# Patient Record
Sex: Female | Born: 1980 | Race: White | Hispanic: No | Marital: Married | Smoking: Never smoker
Health system: Southern US, Community
[De-identification: ages and names within clinical notes are randomized; demographics above are authoritative.]

## PROBLEM LIST (undated history)

## (undated) DIAGNOSIS — J45909 Unspecified asthma, uncomplicated: Secondary | ICD-10-CM

## (undated) HISTORY — DX: Unspecified asthma, uncomplicated: J45.909

## (undated) HISTORY — PX: NO PAST SURGERIES: SHX2092

---

## 2006-10-25 ENCOUNTER — Ambulatory Visit: Payer: Self-pay | Admitting: Family Medicine

## 2007-11-02 ENCOUNTER — Other Ambulatory Visit: Admission: RE | Admit: 2007-11-02 | Discharge: 2007-11-02 | Payer: Self-pay | Admitting: Obstetrics and Gynecology

## 2009-01-28 ENCOUNTER — Other Ambulatory Visit: Admission: RE | Admit: 2009-01-28 | Discharge: 2009-01-28 | Payer: Self-pay | Admitting: Obstetrics and Gynecology

## 2010-01-29 ENCOUNTER — Other Ambulatory Visit: Admission: RE | Admit: 2010-01-29 | Discharge: 2010-01-29 | Payer: Self-pay | Admitting: Obstetrics and Gynecology

## 2011-08-18 ENCOUNTER — Other Ambulatory Visit (HOSPITAL_COMMUNITY)
Admission: RE | Admit: 2011-08-18 | Discharge: 2011-08-18 | Disposition: A | Discharge status: Home / Self Care | Payer: BC Managed Care – PPO | Source: Ambulatory Visit | Attending: Obstetrics and Gynecology | Admitting: Obstetrics and Gynecology

## 2011-08-18 ENCOUNTER — Other Ambulatory Visit: Payer: Self-pay | Admitting: Adult Health

## 2011-08-18 DIAGNOSIS — Z01419 Encounter for gynecological examination (general) (routine) without abnormal findings: Secondary | ICD-10-CM | POA: Insufficient documentation

## 2013-04-16 ENCOUNTER — Encounter: Payer: Self-pay | Admitting: Adult Health

## 2013-04-16 ENCOUNTER — Other Ambulatory Visit (HOSPITAL_COMMUNITY)
Admission: RE | Admit: 2013-04-16 | Discharge: 2013-04-16 | Disposition: A | Discharge status: Home / Self Care | Payer: BC Managed Care – PPO | Source: Ambulatory Visit | Attending: Adult Health | Admitting: Adult Health

## 2013-04-16 ENCOUNTER — Ambulatory Visit (INDEPENDENT_AMBULATORY_CARE_PROVIDER_SITE_OTHER): Payer: BC Managed Care – PPO | Admitting: Adult Health

## 2013-04-16 VITALS — BP 132/88 | HR 76 | Ht 66.0 in | Wt 229.8 lb

## 2013-04-16 DIAGNOSIS — Z01419 Encounter for gynecological examination (general) (routine) without abnormal findings: Secondary | ICD-10-CM | POA: Insufficient documentation

## 2013-04-16 DIAGNOSIS — Z1151 Encounter for screening for human papillomavirus (HPV): Secondary | ICD-10-CM | POA: Insufficient documentation

## 2013-04-16 DIAGNOSIS — Z319 Encounter for procreative management, unspecified: Secondary | ICD-10-CM

## 2013-04-16 NOTE — Progress Notes (Signed)
Patient ID: Krista Caldwell, female   DOB: September 08, 1981, 32 y.o.   MRN: [id number] History of Present Illness: Krista Caldwell is a 32 year old white female, married in for a pap and physical.   Current Medications, Allergies, Past Medical History, Past Surgical History, Family History and Social History were reviewed in [employer] electronic medical record.     Review of Systems:  Patient denies any headaches, blurred vision, shortness of breath, chest pain, abdominal pain, problems with bowel movements, urination, or intercourse. No joint pain, has had a year of family stress with several deaths.She has regular periods and has tried to get period, with out success.Her husband is 76 and has heart problems but has 2 children from prior relationship.   Physical Exam:BP 132/88  Pulse 76  Ht 5\' 6"  (1.676 m)  Wt 229 lb 12.8 oz (104.237 kg)  BMI 37.11 kg/m2  LMP 03/19/2013 General:  Well developed, well nourished, no acute distress Skin:  Warm and dry Neck:  Midline trachea, normal thyroid Lungs; Clear to auscultation bilaterally Breast:  No dominant palpable mass, retraction, or nipple discharge Cardiovascular: Regular rate and rhythm Abdomen:  Soft, non tender, no hepatosplenomegaly Pelvic:  External genitalia is normal in appearance.  The vagina is normal in appearance. The cervix is nullapreous. Pap performed with HPV. Uterus is felt to be normal size, shape, and contour.  No                adnexal masses or tenderness noted. Extremities:  No swelling or varicosities noted Psych:  Alert and cooperative, seems happy Discussed ovulation, timing of sex, and future labs and even trying clomid.Discussed semen analysis and HSG if needed, husband to increase VItamin C intake with OJ.   Impression: Yearly GYN exam Desires pregnancy    Plan: Physical in 1 year Take prenatal vitamin or folic acid Call with next menses to check progesterone level and TSH on day 21 of cycle

## 2013-04-16 NOTE — Patient Instructions (Addendum)
Call with menses, to check progesterone level and TSH Physical in 1 year  Take prenatal vitamin

## 2013-04-17 ENCOUNTER — Encounter: Payer: Self-pay | Admitting: Adult Health

## 2013-05-08 ENCOUNTER — Other Ambulatory Visit: Payer: BC Managed Care – PPO

## 2013-05-08 DIAGNOSIS — Z319 Encounter for procreative management, unspecified: Secondary | ICD-10-CM

## 2013-05-08 LAB — TSH: TSH: 3.271 u[IU]/mL (ref 0.350–4.500)

## 2013-05-10 ENCOUNTER — Telehealth: Payer: Self-pay | Admitting: Adult Health

## 2013-05-10 ENCOUNTER — Other Ambulatory Visit: Payer: Self-pay | Admitting: Adult Health

## 2013-05-10 NOTE — Progress Notes (Signed)
Waiting on progesterone level

## 2013-05-10 NOTE — Telephone Encounter (Signed)
Waiting on progesterone level 

## 2013-05-15 ENCOUNTER — Telehealth: Payer: Self-pay | Admitting: Adult Health

## 2013-05-15 MED ORDER — CLOMIPHENE CITRATE 50 MG PO TABS: 50.0000 mg | ORAL_TABLET | Freq: Every day | ORAL | Status: DC

## 2013-05-15 NOTE — Telephone Encounter (Signed)
Pt wants to try clomid, will call in and she will return on 8/27 for progesterone level.

## 2013-05-30 ENCOUNTER — Other Ambulatory Visit: Payer: BC Managed Care – PPO

## 2013-05-30 DIAGNOSIS — Z319 Encounter for procreative management, unspecified: Secondary | ICD-10-CM

## 2013-05-31 ENCOUNTER — Encounter: Payer: Self-pay | Admitting: Adult Health

## 2013-05-31 ENCOUNTER — Telehealth: Payer: Self-pay | Admitting: Adult Health

## 2013-05-31 NOTE — Telephone Encounter (Signed)
Left message that progesterone level was 14.1 which was good

## 2013-06-05 ENCOUNTER — Other Ambulatory Visit: Payer: Self-pay | Admitting: Adult Health

## 2013-06-05 MED ORDER — CLOMIPHENE CITRATE 50 MG PO TABS: ORAL_TABLET | ORAL | Status: DC

## 2013-06-25 ENCOUNTER — Other Ambulatory Visit: Payer: BC Managed Care – PPO

## 2013-06-25 DIAGNOSIS — IMO0002 Reserved for concepts with insufficient information to code with codable children: Secondary | ICD-10-CM

## 2013-06-26 ENCOUNTER — Encounter: Payer: Self-pay | Admitting: Adult Health

## 2013-06-26 ENCOUNTER — Telehealth: Payer: Self-pay | Admitting: Adult Health

## 2013-06-26 LAB — PROGESTERONE: Progesterone: 19.4 ng/mL

## 2013-06-26 NOTE — Telephone Encounter (Signed)
Sent mychart message

## 2013-07-02 ENCOUNTER — Other Ambulatory Visit: Payer: Self-pay | Admitting: Adult Health

## 2013-07-02 MED ORDER — CLOMIPHENE CITRATE 50 MG PO TABS: ORAL_TABLET | ORAL | Status: DC

## 2013-07-04 ENCOUNTER — Other Ambulatory Visit: Payer: Self-pay | Admitting: *Deleted

## 2013-07-24 ENCOUNTER — Other Ambulatory Visit: Payer: BC Managed Care – PPO

## 2013-07-24 DIAGNOSIS — IMO0002 Reserved for concepts with insufficient information to code with codable children: Secondary | ICD-10-CM

## 2013-07-25 ENCOUNTER — Encounter: Payer: Self-pay | Admitting: Adult Health

## 2013-07-25 LAB — PROGESTERONE: Progesterone: 22.4 ng/mL

## 2013-08-07 ENCOUNTER — Encounter: Payer: Self-pay | Admitting: Obstetrics & Gynecology

## 2013-08-07 ENCOUNTER — Ambulatory Visit (INDEPENDENT_AMBULATORY_CARE_PROVIDER_SITE_OTHER): Payer: BC Managed Care – PPO | Admitting: Obstetrics & Gynecology

## 2013-08-07 VITALS — BP 100/80 | Ht 66.0 in | Wt 230.0 lb

## 2013-08-07 DIAGNOSIS — N979 Female infertility, unspecified: Secondary | ICD-10-CM

## 2013-08-09 ENCOUNTER — Other Ambulatory Visit: Payer: Self-pay

## 2013-08-20 ENCOUNTER — Encounter: Payer: Self-pay | Admitting: Obstetrics & Gynecology

## 2013-09-05 ENCOUNTER — Other Ambulatory Visit (HOSPITAL_COMMUNITY): Payer: BC Managed Care – PPO

## 2013-09-05 ENCOUNTER — Ambulatory Visit (HOSPITAL_COMMUNITY)
Admission: RE | Admit: 2013-09-05 | Discharge: 2013-09-05 | Disposition: A | Discharge status: Home / Self Care | Payer: BC Managed Care – PPO | Source: Ambulatory Visit | Attending: Obstetrics & Gynecology | Admitting: Obstetrics & Gynecology

## 2013-09-05 ENCOUNTER — Encounter (HOSPITAL_COMMUNITY): Payer: Self-pay

## 2013-09-05 ENCOUNTER — Other Ambulatory Visit (HOSPITAL_COMMUNITY): Payer: Self-pay | Admitting: Obstetrics & Gynecology

## 2013-09-05 ENCOUNTER — Other Ambulatory Visit: Payer: Self-pay | Admitting: Obstetrics & Gynecology

## 2013-09-05 DIAGNOSIS — IMO0002 Reserved for concepts with insufficient information to code with codable children: Secondary | ICD-10-CM

## 2013-09-05 DIAGNOSIS — R9389 Abnormal findings on diagnostic imaging of other specified body structures: Secondary | ICD-10-CM | POA: Insufficient documentation

## 2013-09-05 DIAGNOSIS — N979 Female infertility, unspecified: Secondary | ICD-10-CM | POA: Insufficient documentation

## 2013-09-05 MED ORDER — POVIDONE-IODINE 10 % EX SOLN
CUTANEOUS | Status: AC
  Filled 2013-09-05: qty 15

## 2013-09-05 MED ORDER — IOHEXOL 350 MG/ML SOLN
50.0000 mL | Freq: Once | INTRAVENOUS | Status: AC | PRN
  Administered 2013-09-05: 15 mL via INTRAVENOUS

## 2013-09-18 ENCOUNTER — Encounter: Payer: Self-pay | Admitting: Obstetrics & Gynecology

## 2013-10-16 ENCOUNTER — Encounter: Payer: Self-pay | Admitting: Obstetrics & Gynecology

## 2013-11-05 DIAGNOSIS — N979 Female infertility, unspecified: Secondary | ICD-10-CM | POA: Insufficient documentation

## 2013-11-05 NOTE — Progress Notes (Signed)
Patient ID: Krista Caldwell, female   DOB: 10/21/1980, 33 y.o.   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 Has regular menses normal volume and discomfort No dyspareunia No hhistory of pid or other pelvic infections No history of early loss  Will need to check an HSG Probable female factor  Will need to evaluate that after HSG  Past Medical History  Diagnosis Date  . Asthma     History reviewed. No pertinent past surgical history.  OB History   Grav Para Term Preterm Abortions TAB SAB Ect Mult Living                  Allergies  Allergen Reactions  . Motrin [Ibuprofen]     Nose bleeds    History   Social History  . Marital Status: Single    Spouse Name: N/A    Number of Children: N/A  . Years of Education: N/A   Social History Main Topics  . Smoking status: Never Smoker   . Smokeless tobacco: Never Used  . Alcohol Use: No  . Drug Use: No  . Sexual Activity: Yes   Other Topics Concern  . None   Social History Narrative  . None    Family History  Problem Relation Age of Onset  . Stroke Maternal Grandmother   . Heart disease Maternal Grandfather     heart attack  . Asthma Paternal Grandmother   . Dementia Paternal Grandfather

## 2014-01-25 IMAGING — RF DG HYSTEROGRAM
4 series · 4 of 4 positions shown · IV contrast (omnipaque)
Comparison: None

FLUOROSCOPY TIME:  1 min 42 seconds

CLINICAL DATA: Primary infertility

EXAM:
HYSTEROSALPINGOGRAM
TECHNIQUE: Following cleansing of the cervix and vagina with Betadine solution,
the referring physician performed a hysterosalpingogram using a
5-French hysterosalpingogram catheter and Omnipaque 300 contrast.
The patient tolerated the examination without difficulty.

[Series 1: run · 1 of 1 slices shown (1 of 4)]
[im 1/1]
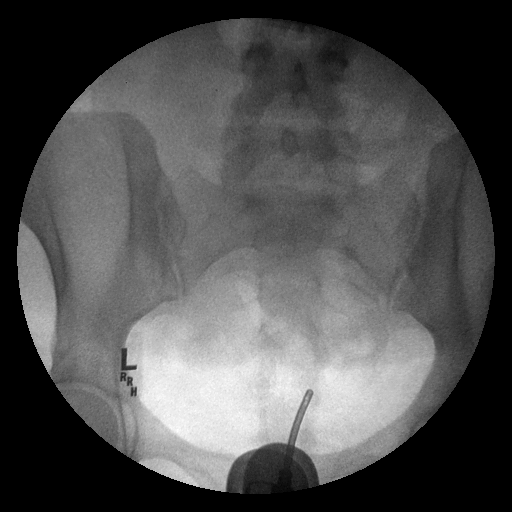

[Series 2: run · 1 of 1 slices shown (2 of 4)]
[im 1/1]
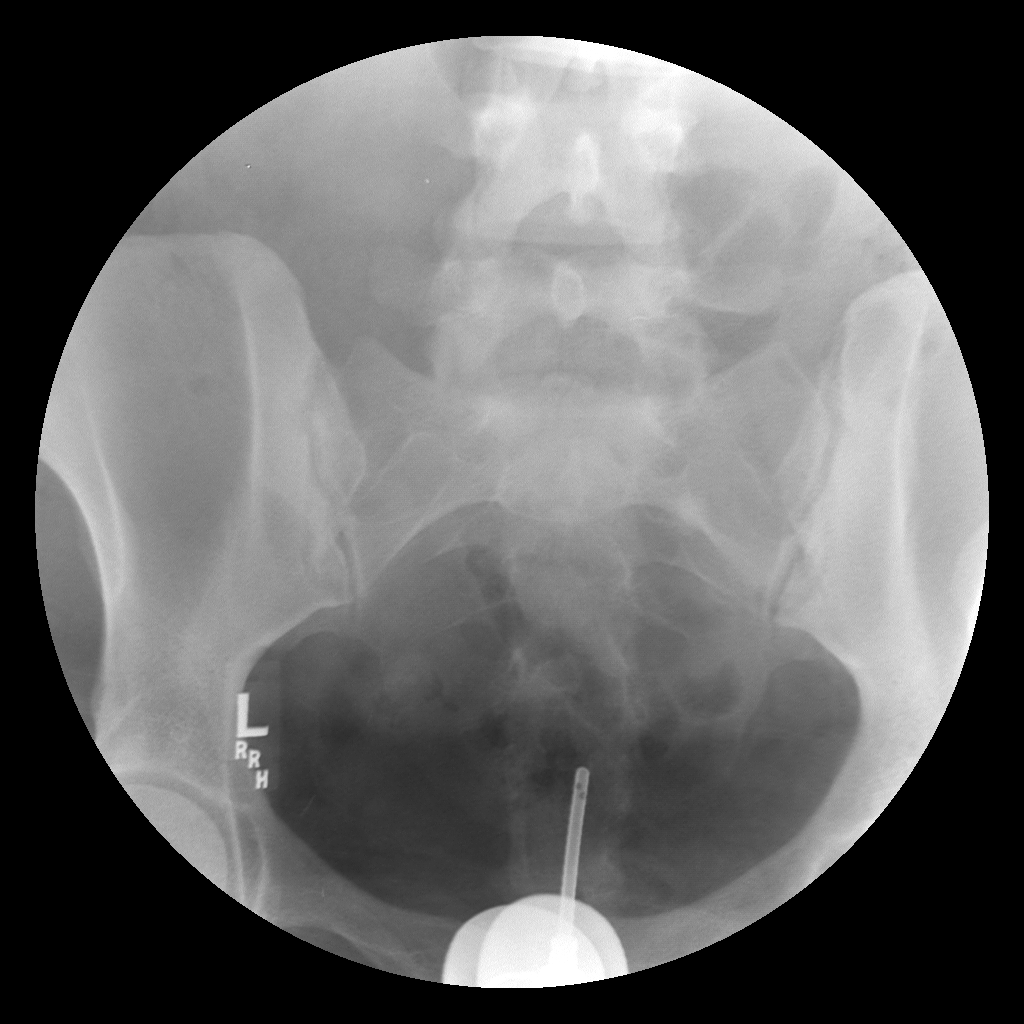

[Series 3: run · 1 of 1 slices shown (3 of 4)]
[im 1/1]
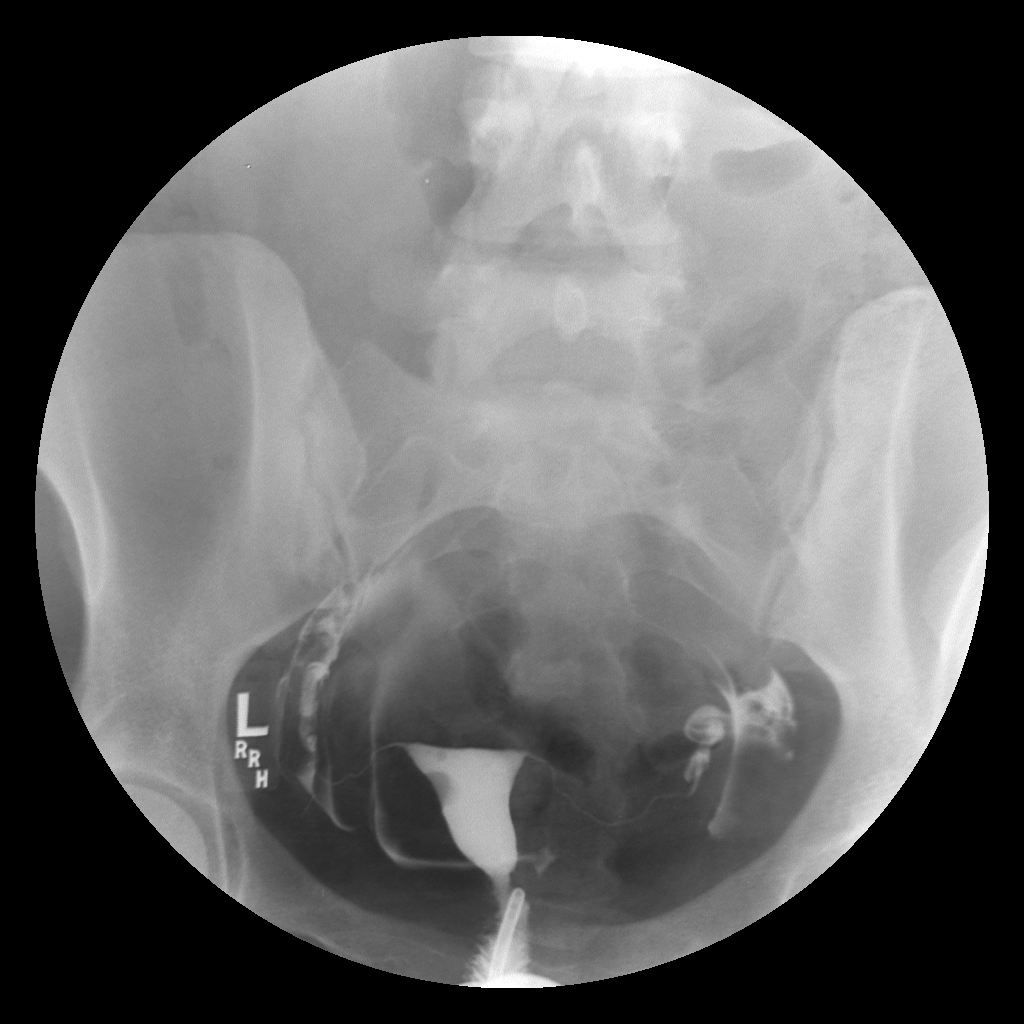

[Series 4: run · 1 of 1 slices shown (4 of 4)]
[im 1/1]
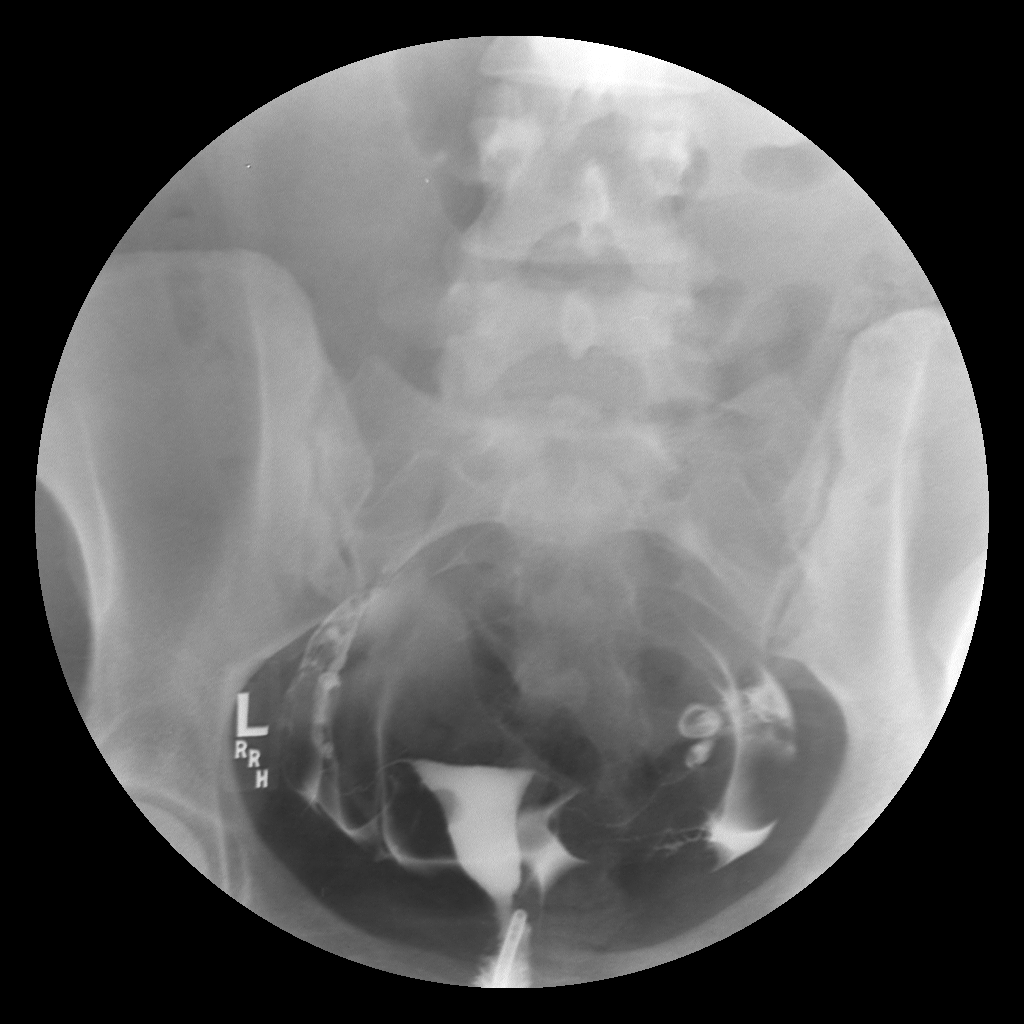

[4 of 4 positions shown; findings below may reference images not displayed]

FINDINGS: Endometrial cavity distends normally.

Small filling defect identified along right lateral aspect question
small polyp or submucosal leiomyoma.

Additional small air bubble is noted on series 3, no longer seen on
series 4.

Patent fallopian tubes bilaterally was free spillage of contrast
into the pelvic peritoneal cavity.
IMPRESSION: Patent fallopian tubes bilaterally.

Small polyp versus submucosal leiomyoma along the right lateral
aspect of the endometrial canal.

## 2014-06-13 ENCOUNTER — Encounter: Payer: Self-pay | Admitting: Obstetrics & Gynecology

## 2014-08-21 LAB — US OB COMP LESS 14 WKS

## 2014-08-26 ENCOUNTER — Telehealth: Payer: Self-pay | Admitting: Adult Health

## 2014-08-26 NOTE — Telephone Encounter (Signed)
Left message saying congratulations on pregnancy

## 2014-08-27 ENCOUNTER — Other Ambulatory Visit: Payer: Self-pay | Admitting: Obstetrics and Gynecology

## 2014-08-27 DIAGNOSIS — O3680X0 Pregnancy with inconclusive fetal viability, not applicable or unspecified: Secondary | ICD-10-CM

## 2014-09-02 ENCOUNTER — Ambulatory Visit (INDEPENDENT_AMBULATORY_CARE_PROVIDER_SITE_OTHER): Payer: BC Managed Care – PPO

## 2014-09-02 ENCOUNTER — Other Ambulatory Visit: Payer: Self-pay | Admitting: Obstetrics and Gynecology

## 2014-09-02 DIAGNOSIS — O0901 Supervision of pregnancy with history of infertility, first trimester: Secondary | ICD-10-CM

## 2014-09-02 DIAGNOSIS — O3680X0 Pregnancy with inconclusive fetal viability, not applicable or unspecified: Secondary | ICD-10-CM

## 2014-09-02 NOTE — Progress Notes (Signed)
U/S(8+6wks)-single IUP with +FCA noted, FHR-176 bpm, CRL c/w dates, cx appears closed, bilateral adnexa appears WNL

## 2014-09-09 ENCOUNTER — Encounter: Payer: Self-pay | Admitting: Women's Health

## 2014-09-09 ENCOUNTER — Ambulatory Visit (INDEPENDENT_AMBULATORY_CARE_PROVIDER_SITE_OTHER): Payer: BC Managed Care – PPO | Admitting: Women's Health

## 2014-09-09 VITALS — BP 118/80 | Wt 221.0 lb

## 2014-09-09 DIAGNOSIS — Z0283 Encounter for blood-alcohol and blood-drug test: Secondary | ICD-10-CM

## 2014-09-09 DIAGNOSIS — Z118 Encounter for screening for other infectious and parasitic diseases: Secondary | ICD-10-CM

## 2014-09-09 DIAGNOSIS — Z0184 Encounter for antibody response examination: Secondary | ICD-10-CM

## 2014-09-09 DIAGNOSIS — R071 Chest pain on breathing: Secondary | ICD-10-CM

## 2014-09-09 DIAGNOSIS — Z1159 Encounter for screening for other viral diseases: Secondary | ICD-10-CM

## 2014-09-09 DIAGNOSIS — Z1389 Encounter for screening for other disorder: Secondary | ICD-10-CM

## 2014-09-09 DIAGNOSIS — Z3400 Encounter for supervision of normal first pregnancy, unspecified trimester: Secondary | ICD-10-CM | POA: Insufficient documentation

## 2014-09-09 DIAGNOSIS — Z331 Pregnant state, incidental: Secondary | ICD-10-CM

## 2014-09-09 DIAGNOSIS — R0789 Other chest pain: Secondary | ICD-10-CM

## 2014-09-09 DIAGNOSIS — Z3401 Encounter for supervision of normal first pregnancy, first trimester: Secondary | ICD-10-CM

## 2014-09-09 DIAGNOSIS — Z113 Encounter for screening for infections with a predominantly sexual mode of transmission: Secondary | ICD-10-CM

## 2014-09-09 DIAGNOSIS — Z114 Encounter for screening for human immunodeficiency virus [HIV]: Secondary | ICD-10-CM

## 2014-09-09 LAB — POCT URINALYSIS DIPSTICK
Glucose, UA: NEGATIVE
Ketones, UA: NEGATIVE
Leukocytes, UA: NEGATIVE
Nitrite, UA: NEGATIVE
PROTEIN UA: NEGATIVE
RBC UA: NEGATIVE

## 2014-09-09 NOTE — Progress Notes (Signed)
  Subjective:  Krista Caldwell is a 33 y.o. G1P0 Caucasian female at 7967w6d by LMP c/w 8wk u/s.  being seen today for her first obstetrical visit.  Her obstetrical history is significant for infertility: tried to conceive x 4 years w/o success[place] On daily progesterone per infertility md until out of 1st trimester. Pregnancy history fully reviewed.  Patient reports some nausea- declines meds at this time. Bad cold ~2wks ago, took round of ampicillin- cold has resolved but has had pain in upper Rt lateral rib x last week. Hurts to touch/lay on area. . Denies vb, cramping, uti s/s, abnormal/malodorous vag d/c, or vulvovaginal itching/irritation.  BP 118/80 mmHg  Wt 221 lb (100.245 kg)  LMP 07/02/2014 (Exact Date)  HISTORY: OB History  Gravida Para Term Preterm AB SAB TAB Ectopic Multiple Living  1             # Outcome Date GA Lbr Len/2nd Weight Sex Delivery Anes PTL Lv  1 Current              Past Medical History  Diagnosis Date  . Asthma    History reviewed. No pertinent past surgical history. Family History  Problem Relation Age of Onset  . Stroke Maternal Grandmother   . Heart disease Maternal Grandfather     heart attack  . Asthma Paternal Grandmother   . Dementia Paternal Grandfather     Exam   System:     General: Well developed & nourished, no acute distress   Skin: Warm & dry, normal coloration and turgor, no rashes   Neurologic: Alert & oriented, normal mood   Cardiovascular: Regular rate & rhythm   Respiratory: Effort & rate normal, LCTAB, acyanotic   Abdomen: Soft, non tender   Extremities: normal strength, tone  Under Rt axilla near bra line tender to touch- possible costochondritis from recent cold Thin prep pap smear neg w/ -HRHPV 04/16/14  FHR: 175 via doppler   Assessment:   Pregnancy: G1P0 Patient Active Problem List   Diagnosis Date Noted  . Supervision of normal first pregnancy 09/09/2014    Priority: High   . Infertility, female, primary 11/05/2013  . Patient desires pregnancy 04/16/2013    4467w6d G1P0 New OB visit Possible Rt-sided costochondritis from recent cold N/V of pregnancy Pregnancy result of IUI on 07/17/14 after 4 years of infertility  Plan:  Initial labs drawn Continue prenatal vitamins Problem list reviewed and updated Reviewed n/v relief measures and warning s/s to report Reviewed recommended weight gain based on pre-gravid BMI Encouraged well-balanced diet Genetic Screening discussed Integrated Screen: declined Cystic fibrosis screening discussed declined Ultrasound discussed; fetal survey: requested Follow up in 4 weeks for visit CCNC completed Ice/heat, apap, rest for Rt sided costochondritis  Marge DuncansBooker, Kimberly Randall CNM, [facility]NP[facility]BC 09/09/2014 4:22 PM

## 2014-09-09 NOTE — Patient Instructions (Signed)

## 2014-09-10 LAB — CBC
HEMATOCRIT: 37.3 % (ref 36.0–46.0)
Hemoglobin: 13.1 g/dL (ref 12.0–15.0)
MCH: 28.4 pg (ref 26.0–34.0)
MCHC: 35.1 g/dL (ref 30.0–36.0)
MCV: 80.7 fL (ref 78.0–100.0)
MPV: 11.2 fL (ref 9.4–12.4)
Platelets: 232 10*3/uL (ref 150–400)
RBC: 4.62 MIL/uL (ref 3.87–5.11)
RDW: 14.4 % (ref 11.5–15.5)
WBC: 8.4 10*3/uL (ref 4.0–10.5)

## 2014-09-10 LAB — URINALYSIS, MICROSCOPIC ONLY
Casts: NONE SEEN
Crystals: NONE SEEN

## 2014-09-10 LAB — URINALYSIS, ROUTINE W REFLEX MICROSCOPIC
BILIRUBIN URINE: NEGATIVE
Glucose, UA: NEGATIVE mg/dL
Hgb urine dipstick: NEGATIVE
KETONES UR: NEGATIVE mg/dL
Leukocytes, UA: NEGATIVE
Nitrite: NEGATIVE
Protein, ur: NEGATIVE mg/dL
Specific Gravity, Urine: 1.03 (ref 1.005–1.030)
UROBILINOGEN UA: 0.2 mg/dL (ref 0.0–1.0)
pH: 6 (ref 5.0–8.0)

## 2014-09-10 LAB — DRUG SCREEN, URINE, NO CONFIRMATION
Amphetamine Screen, Ur: NEGATIVE
BARBITURATE QUANT UR: NEGATIVE
Benzodiazepines.: NEGATIVE
CREATININE, U: 217.1 mg/dL
Cocaine Metabolites: NEGATIVE
Marijuana Metabolite: NEGATIVE
Methadone: NEGATIVE
OPIATE SCREEN, URINE: NEGATIVE
PROPOXYPHENE: NEGATIVE
Phencyclidine (PCP): NEGATIVE

## 2014-09-10 LAB — ANTIBODY SCREEN: ANTIBODY SCREEN: NEGATIVE

## 2014-09-10 LAB — URINE CULTURE
Colony Count: NO GROWTH
Organism ID, Bacteria: NO GROWTH

## 2014-09-10 LAB — OXYCODONE SCREEN, UA, RFLX CONFIRM: Oxycodone Screen, Ur: NEGATIVE ng/mL

## 2014-09-10 LAB — HIV ANTIBODY (ROUTINE TESTING W REFLEX): HIV: NONREACTIVE

## 2014-09-10 LAB — ABO AND RH: RH TYPE: POSITIVE

## 2014-09-10 LAB — RPR

## 2014-09-10 LAB — RUBELLA SCREEN: RUBELLA: 1.96 {index} — AB (ref <0.90)

## 2014-09-10 LAB — HEPATITIS B SURFACE ANTIGEN: Hepatitis B Surface Ag: NEGATIVE

## 2014-09-10 LAB — GC/CHLAMYDIA PROBE AMP
CT PROBE, AMP APTIMA: NEGATIVE
GC Probe RNA: NEGATIVE

## 2014-09-10 LAB — VARICELLA ZOSTER ANTIBODY, IGG: Varicella IgG: 1615 Index — ABNORMAL HIGH (ref <135.00)

## 2014-09-12 ENCOUNTER — Encounter: Payer: Self-pay | Admitting: Obstetrics & Gynecology

## 2014-10-04 NOTE — L&D Delivery Note (Signed)
Delivery Note At 11:08 AM a viable female was delivered via Vaginal, Spontaneous Delivery (Presentation: Left Occiput Anterior).  APGAR: 9, 9; weight 6-13.   Placenta status: Intact, Spontaneous.  Cord: 3 vessels with the following complications: None.  Cord pH: NA.  Anesthesia: Epidural  Episiotomy: None Lacerations: 2nd degree;Sulcus Suture Repair: 3.0 vicryl rapide Est. Blood Loss (mL): 464 ml   Mom to postpartum.  Baby to Couplet care / Skin to Skin. Placenta to: Birthing Suites Feeding: Breast Circ: NA Contraception: Sherlynn StallsUndecided  Aldred Mase 04/08/2015, 1:41 PM

## 2014-10-08 ENCOUNTER — Ambulatory Visit (INDEPENDENT_AMBULATORY_CARE_PROVIDER_SITE_OTHER): Payer: BC Managed Care – PPO | Admitting: Obstetrics & Gynecology

## 2014-10-08 ENCOUNTER — Encounter: Payer: Self-pay | Admitting: Obstetrics & Gynecology

## 2014-10-08 VITALS — BP 100/80 | Wt 216.0 lb

## 2014-10-08 DIAGNOSIS — Z331 Pregnant state, incidental: Secondary | ICD-10-CM

## 2014-10-08 DIAGNOSIS — Z3401 Encounter for supervision of normal first pregnancy, first trimester: Secondary | ICD-10-CM | POA: Diagnosis not present

## 2014-10-08 DIAGNOSIS — Z3492 Encounter for supervision of normal pregnancy, unspecified, second trimester: Secondary | ICD-10-CM

## 2014-10-08 DIAGNOSIS — Z1389 Encounter for screening for other disorder: Secondary | ICD-10-CM

## 2014-10-08 LAB — POCT URINALYSIS DIPSTICK
Glucose, UA: NEGATIVE
Ketones, UA: NEGATIVE
Leukocytes, UA: NEGATIVE
NITRITE UA: NEGATIVE
Protein, UA: NEGATIVE
RBC UA: NEGATIVE

## 2014-10-08 NOTE — Progress Notes (Signed)
G1P0 7784w0d Estimated Date of Delivery: 04/08/15  Blood pressure 100/80, weight 216 lb (97.977 kg), last menstrual period 07/02/2014.   BP weight and urine results all reviewed and noted.  Please refer to the obstetrical flow sheet for the fundal height and fetal heart rate documentation:  Patient reports good fetal movement, denies any bleeding and no rupture of membranes symptoms or regular contractions. Patient is without complaints. All questions were answered.  Plan:  Continued routine obstetrical care,   Follow up in 4 weeks for OB appointment, sonogram

## 2014-11-05 ENCOUNTER — Encounter: Payer: Self-pay | Admitting: Obstetrics and Gynecology

## 2014-11-05 ENCOUNTER — Ambulatory Visit (INDEPENDENT_AMBULATORY_CARE_PROVIDER_SITE_OTHER): Payer: BC Managed Care – PPO | Admitting: Obstetrics and Gynecology

## 2014-11-05 DIAGNOSIS — Z331 Pregnant state, incidental: Secondary | ICD-10-CM

## 2014-11-05 DIAGNOSIS — Z1389 Encounter for screening for other disorder: Secondary | ICD-10-CM

## 2014-11-05 DIAGNOSIS — Z3402 Encounter for supervision of normal first pregnancy, second trimester: Secondary | ICD-10-CM

## 2014-11-05 LAB — POCT URINALYSIS DIPSTICK
Blood, UA: NEGATIVE
Glucose, UA: NEGATIVE
KETONES UA: NEGATIVE
LEUKOCYTES UA: NEGATIVE
NITRITE UA: NEGATIVE
PROTEIN UA: NEGATIVE

## 2014-11-05 NOTE — Progress Notes (Signed)
Pt denies any problems or concerns at this time.  

## 2014-11-05 NOTE — Progress Notes (Signed)
G1P0 7251w0d Estimated Date of Delivery: 04/08/15  Blood pressure 120/76, weight 216 lb (97.977 kg), last menstrual period 07/02/2014.   refer to the ob flow sheet for FH and FHR, also BP, Wt, Urine results:notable for none  Patient reports  slight good fetal movement, denies any bleeding and no rupture of membranes symptoms or regular contractions. Patient complaints:none.  Questions were answered. Assessment:  Plan:  Continued routine obstetrical care,   F/u in 2  weeks for  u/s

## 2014-11-19 ENCOUNTER — Ambulatory Visit (INDEPENDENT_AMBULATORY_CARE_PROVIDER_SITE_OTHER): Payer: BC Managed Care – PPO | Admitting: Obstetrics and Gynecology

## 2014-11-19 ENCOUNTER — Other Ambulatory Visit: Payer: BC Managed Care – PPO

## 2014-11-19 ENCOUNTER — Other Ambulatory Visit: Payer: Self-pay | Admitting: Obstetrics & Gynecology

## 2014-11-19 ENCOUNTER — Ambulatory Visit (INDEPENDENT_AMBULATORY_CARE_PROVIDER_SITE_OTHER): Payer: BC Managed Care – PPO

## 2014-11-19 ENCOUNTER — Encounter: Payer: Self-pay | Admitting: Obstetrics and Gynecology

## 2014-11-19 VITALS — BP 118/74 | Wt 217.0 lb

## 2014-11-19 DIAGNOSIS — Z3689 Encounter for other specified antenatal screening: Secondary | ICD-10-CM

## 2014-11-19 DIAGNOSIS — Z3493 Encounter for supervision of normal pregnancy, unspecified, third trimester: Secondary | ICD-10-CM

## 2014-11-19 DIAGNOSIS — Z3492 Encounter for supervision of normal pregnancy, unspecified, second trimester: Secondary | ICD-10-CM

## 2014-11-19 DIAGNOSIS — Z1389 Encounter for screening for other disorder: Secondary | ICD-10-CM

## 2014-11-19 DIAGNOSIS — O350XX0 Maternal care for (suspected) central nervous system malformation in fetus, not applicable or unspecified: Secondary | ICD-10-CM

## 2014-11-19 DIAGNOSIS — Z36 Encounter for antenatal screening of mother: Secondary | ICD-10-CM

## 2014-11-19 DIAGNOSIS — O283 Abnormal ultrasonic finding on antenatal screening of mother: Secondary | ICD-10-CM

## 2014-11-19 DIAGNOSIS — Z331 Pregnant state, incidental: Secondary | ICD-10-CM

## 2014-11-19 DIAGNOSIS — IMO0002 Reserved for concepts with insufficient information to code with codable children: Secondary | ICD-10-CM

## 2014-11-19 LAB — POCT URINALYSIS DIPSTICK
Blood, UA: NEGATIVE
Glucose, UA: NEGATIVE
KETONES UA: NEGATIVE
Leukocytes, UA: NEGATIVE
Nitrite, UA: NEGATIVE
PROTEIN UA: NEGATIVE

## 2014-11-19 NOTE — Progress Notes (Signed)
G1P0 3351w0d Estimated Date of Delivery: 04/08/15  Blood pressure 118/74, weight 217 lb (98.431 kg), last menstrual period 07/02/2014.   refer to the ob flow sheet for FH and FHR, also BP, Wt, Urine results:notable for negative  Patient reports  Too early for good fetal movement, denies any bleeding and no rupture of membranes symptoms or regular contractions. Patient complaints:none U/s reviewed: Bilateral Choroid plexus cysts, 4 mm, with otherwise normal anatomic survey. Will remeasure at 28 wk. Pt and husb informed, reassured..  Questions were answered. Assessment:  Plan:  Continued routine obstetrical care, 4wk  F/u in 4 weeks for pnx, 8 wk for rpt u/s

## 2014-11-19 NOTE — Progress Notes (Signed)
Pt denies any problems or concerns at this time.  

## 2014-11-19 NOTE — Progress Notes (Signed)
U/S( 20+0wks)-single active fetus, FHR-166 bpm, cx appears closed (3.1cm), bilateral adnexa appears WNL, anterior/fundal Gr 0 placenta, female fetus, Bilateral CP cysts noted, no other abn noted

## 2014-12-18 ENCOUNTER — Encounter: Payer: Self-pay | Admitting: Obstetrics and Gynecology

## 2014-12-18 ENCOUNTER — Ambulatory Visit (INDEPENDENT_AMBULATORY_CARE_PROVIDER_SITE_OTHER): Payer: BC Managed Care – PPO | Admitting: Obstetrics and Gynecology

## 2014-12-18 DIAGNOSIS — Z331 Pregnant state, incidental: Secondary | ICD-10-CM

## 2014-12-18 DIAGNOSIS — Z1389 Encounter for screening for other disorder: Secondary | ICD-10-CM

## 2014-12-18 DIAGNOSIS — Z3402 Encounter for supervision of normal first pregnancy, second trimester: Secondary | ICD-10-CM

## 2014-12-18 LAB — POCT URINALYSIS DIPSTICK
Glucose, UA: NEGATIVE
Ketones, UA: NEGATIVE
LEUKOCYTES UA: NEGATIVE
NITRITE UA: NEGATIVE
Protein, UA: NEGATIVE
RBC UA: NEGATIVE

## 2014-12-18 NOTE — Progress Notes (Signed)
Patient ID: Krista Caldwell, female   DOB: 07/25/1981, 34 y.o.   MRN: [id number]  This chart was SCRIBED for Christin BachJohn Evey Mcmahan, MD by Ronney LionSuzanne Le, ED Scribe. This patient was seen in room 1 and the patient's care was started at 3:35 PM.   G1P0 5763w1d Estimated Date of Delivery: 04/08/15  Blood pressure 120/60, pulse 80, weight 217 lb (98.431 kg), last menstrual period 07/02/2014.   refer to the ob flow sheet for FH and FHR, also BP, Wt, Urine results:notable for negative  Patient reports  + good fetal movement, denies any bleeding and no rupture of membranes symptoms or regular contractions. Patient complaints: Patient has no complaints at this time.  FHR: 156  Questions were answered. Assessment:  Plan:  Continued routine obstetrical care, pn2 labs in 4wk, also u/s  F/u in 4 weeks for US  I personally performed the services described in this documentation, which was SCRIBED in my presence. The recorded information has been reviewed and considered accurate. It has been edited as necessary during review. Tilda BurrowFERGUSON,Brannan Cassedy V, MD

## 2014-12-18 NOTE — Progress Notes (Signed)
Pt denies any problems or concerns at this time.  

## 2015-01-13 ENCOUNTER — Other Ambulatory Visit: Payer: Self-pay | Admitting: Obstetrics and Gynecology

## 2015-01-13 DIAGNOSIS — IMO0001 Reserved for inherently not codable concepts without codable children: Secondary | ICD-10-CM

## 2015-01-15 ENCOUNTER — Other Ambulatory Visit: Payer: BC Managed Care – PPO

## 2015-01-16 ENCOUNTER — Encounter: Payer: Self-pay | Admitting: Advanced Practice Midwife

## 2015-01-16 ENCOUNTER — Other Ambulatory Visit: Payer: BC Managed Care – PPO

## 2015-01-16 ENCOUNTER — Ambulatory Visit (INDEPENDENT_AMBULATORY_CARE_PROVIDER_SITE_OTHER): Payer: BC Managed Care – PPO

## 2015-01-16 ENCOUNTER — Ambulatory Visit (INDEPENDENT_AMBULATORY_CARE_PROVIDER_SITE_OTHER): Payer: BC Managed Care – PPO | Admitting: Advanced Practice Midwife

## 2015-01-16 VITALS — BP 108/70 | HR 96 | Wt 219.0 lb

## 2015-01-16 DIAGNOSIS — Z331 Pregnant state, incidental: Secondary | ICD-10-CM

## 2015-01-16 DIAGNOSIS — Z369 Encounter for antenatal screening, unspecified: Secondary | ICD-10-CM

## 2015-01-16 DIAGNOSIS — Z3402 Encounter for supervision of normal first pregnancy, second trimester: Secondary | ICD-10-CM

## 2015-01-16 DIAGNOSIS — IMO0001 Reserved for inherently not codable concepts without codable children: Secondary | ICD-10-CM

## 2015-01-16 DIAGNOSIS — O350XX1 Maternal care for (suspected) central nervous system malformation in fetus, fetus 1: Secondary | ICD-10-CM

## 2015-01-16 DIAGNOSIS — Z1389 Encounter for screening for other disorder: Secondary | ICD-10-CM

## 2015-01-16 DIAGNOSIS — Z131 Encounter for screening for diabetes mellitus: Secondary | ICD-10-CM

## 2015-01-16 DIAGNOSIS — Z3403 Encounter for supervision of normal first pregnancy, third trimester: Secondary | ICD-10-CM

## 2015-01-16 LAB — POCT URINALYSIS DIPSTICK
Blood, UA: NEGATIVE
Glucose, UA: NEGATIVE
Ketones, UA: NEGATIVE
Leukocytes, UA: NEGATIVE
Nitrite, UA: NEGATIVE
PROTEIN UA: NEGATIVE

## 2015-01-16 NOTE — Progress Notes (Signed)
US [redacted]W[redacted]D C/W dates,no choriod plexus cyst seen,ant pl grade 0,cephalic,cx appears closed,normal ov's,fht 138bpm,afi 12.7cm,efw 1229g (2lbs 11oz)

## 2015-01-16 NOTE — Progress Notes (Signed)
G1P0 5550w2d Estimated Date of Delivery: 04/08/15  Blood pressure 108/70, pulse 96, weight 219 lb (99.338 kg), last menstrual period 07/02/2014.   BP weight and urine results all reviewed and noted.  Please refer to the obstetrical flow sheet for the fundal height and fetal heart rate documentation: Has US today to rechec Choriod plexus cysts:  US [redacted]W[redacted]D C/W dates,no choriod plexus cyst seen,ant pl grade 0,cephalic,cx appears closed,normal ov's,fht 138bpm,afi 12.7cm,efw 1229g (2lbs 11oz)  Patient reports good fetal movement, denies any bleeding and no rupture of membranes symptoms or regular contractions. Patient is without complaints. All questions were answered.  Plan:  Continued routine obstetrical care, PN2 today  Follow up in 3 weeks for OB appointment,

## 2015-01-16 NOTE — Progress Notes (Signed)
Pt states that she has been experiencing some low back pain that comes and goes for about a week.

## 2015-01-16 NOTE — Patient Instructions (Signed)

## 2015-01-17 LAB — ANTIBODY SCREEN: Antibody Screen: NEGATIVE

## 2015-01-17 LAB — CBC
HCT: 37.6 % (ref 34.0–46.6)
Hemoglobin: 12.4 g/dL (ref 11.1–15.9)
MCH: 29.2 pg (ref 26.6–33.0)
MCHC: 33 g/dL (ref 31.5–35.7)
MCV: 89 fL (ref 79–97)
PLATELETS: 219 10*3/uL (ref 150–379)
RBC: 4.24 x10E6/uL (ref 3.77–5.28)
RDW: 13.4 % (ref 12.3–15.4)
WBC: 10.7 10*3/uL (ref 3.4–10.8)

## 2015-01-17 LAB — GLUCOSE TOLERANCE, 2 HOURS W/ 1HR
GLUCOSE, 1 HOUR: 97 mg/dL (ref 65–179)
GLUCOSE, FASTING: 68 mg/dL (ref 65–91)
Glucose, 2 hour: 132 mg/dL (ref 65–152)

## 2015-01-17 LAB — HSV 2 ANTIBODY, IGG: HSV 2 Glycoprotein G Ab, IgG: <0.91 index (ref 0.00–0.90)

## 2015-01-17 LAB — HIV ANTIBODY (ROUTINE TESTING W REFLEX): HIV SCREEN 4TH GENERATION: NONREACTIVE

## 2015-01-17 LAB — RPR: RPR Ser Ql: NONREACTIVE

## 2015-02-06 ENCOUNTER — Ambulatory Visit (INDEPENDENT_AMBULATORY_CARE_PROVIDER_SITE_OTHER): Payer: BC Managed Care – PPO | Admitting: Advanced Practice Midwife

## 2015-02-06 VITALS — BP 106/68 | HR 91 | Wt 221.0 lb

## 2015-02-06 DIAGNOSIS — Z3403 Encounter for supervision of normal first pregnancy, third trimester: Secondary | ICD-10-CM

## 2015-02-06 DIAGNOSIS — Z1389 Encounter for screening for other disorder: Secondary | ICD-10-CM

## 2015-02-06 DIAGNOSIS — Z331 Pregnant state, incidental: Secondary | ICD-10-CM

## 2015-02-06 LAB — POCT URINALYSIS DIPSTICK
Blood, UA: NEGATIVE
GLUCOSE UA: NEGATIVE
Ketones, UA: NEGATIVE
Leukocytes, UA: NEGATIVE
Nitrite, UA: NEGATIVE

## 2015-02-06 NOTE — Progress Notes (Signed)
G1P0 3030w2d Estimated Date of Delivery: 04/08/15  Blood pressure 106/68, pulse 91, weight 221 lb (100.245 kg), last menstrual period 07/02/2014.   BP weight and urine results all reviewed and noted.  Please refer to the obstetrical flow sheet for the fundal height and fetal heart rate documentation:  Patient reports good fetal movement, denies any bleeding and no rupture of membranes symptoms or regular contractions. Patient is without complaints. All questions were answered.  Plan:  Continued routine obstetrical care,   Follow up in 2 weeks for OB appointment,

## 2015-02-20 ENCOUNTER — Encounter: Payer: BC Managed Care – PPO | Admitting: Obstetrics and Gynecology

## 2015-02-20 ENCOUNTER — Ambulatory Visit (INDEPENDENT_AMBULATORY_CARE_PROVIDER_SITE_OTHER): Payer: BC Managed Care – PPO | Admitting: Obstetrics & Gynecology

## 2015-02-20 VITALS — BP 108/68 | HR 104 | Wt 224.0 lb

## 2015-02-20 DIAGNOSIS — Z3403 Encounter for supervision of normal first pregnancy, third trimester: Secondary | ICD-10-CM

## 2015-02-20 DIAGNOSIS — Z1389 Encounter for screening for other disorder: Secondary | ICD-10-CM

## 2015-02-20 DIAGNOSIS — Z331 Pregnant state, incidental: Secondary | ICD-10-CM

## 2015-02-20 LAB — POCT URINALYSIS DIPSTICK
Glucose, UA: NEGATIVE
Ketones, UA: NEGATIVE
Leukocytes, UA: NEGATIVE
Nitrite, UA: NEGATIVE
RBC UA: NEGATIVE

## 2015-02-20 NOTE — Progress Notes (Signed)
G1P0 5476w2d Estimated Date of Delivery: 04/08/15  Blood pressure 108/68, pulse 104, weight 224 lb (101.606 kg), last menstrual period 07/02/2014.   BP weight and urine results all reviewed and noted.  Please refer to the obstetrical flow sheet for the fundal height and fetal heart rate documentation:  Patient reports good fetal movement, denies any bleeding and no rupture of membranes symptoms or regular contractions. Patient is without complaints. All questions were answered.  Plan:  Continued routine obstetrical care,   Follow up in 2 weeks for OB appointment, routine

## 2015-03-06 ENCOUNTER — Encounter: Payer: Self-pay | Admitting: Advanced Practice Midwife

## 2015-03-06 ENCOUNTER — Ambulatory Visit (INDEPENDENT_AMBULATORY_CARE_PROVIDER_SITE_OTHER): Payer: BC Managed Care – PPO | Admitting: Advanced Practice Midwife

## 2015-03-06 VITALS — BP 102/70 | HR 76 | Wt 224.0 lb

## 2015-03-06 DIAGNOSIS — Z3403 Encounter for supervision of normal first pregnancy, third trimester: Secondary | ICD-10-CM

## 2015-03-06 DIAGNOSIS — Z331 Pregnant state, incidental: Secondary | ICD-10-CM

## 2015-03-06 DIAGNOSIS — Z1389 Encounter for screening for other disorder: Secondary | ICD-10-CM

## 2015-03-06 LAB — POCT URINALYSIS DIPSTICK
GLUCOSE UA: NEGATIVE
KETONES UA: NEGATIVE
LEUKOCYTES UA: NEGATIVE
Nitrite, UA: NEGATIVE
Protein, UA: NEGATIVE
RBC UA: NEGATIVE

## 2015-03-06 NOTE — Progress Notes (Signed)
G1P0 2722w2d Estimated Date of Delivery: 04/08/15  Blood pressure 102/70, pulse 76, weight 224 lb (101.606 kg), last menstrual period 07/02/2014.   BP weight and urine results all reviewed and noted.  Please refer to the obstetrical flow sheet for the fundal height and fetal heart rate documentation:  Patient reports good fetal movement, denies any bleeding and no rupture of membranes symptoms or regular contractions. Patient is without complaints. Had3d US last week, baby vertex and no cord around the neck  All questions were answered.  Plan:  Continued routine obstetrical care,   Follow up in 2 weeks for OB appointment,

## 2015-03-20 ENCOUNTER — Encounter: Payer: Self-pay | Admitting: Obstetrics & Gynecology

## 2015-03-20 ENCOUNTER — Ambulatory Visit (INDEPENDENT_AMBULATORY_CARE_PROVIDER_SITE_OTHER): Payer: BC Managed Care – PPO | Admitting: Obstetrics & Gynecology

## 2015-03-20 VITALS — BP 100/80 | HR 80 | Wt 224.0 lb

## 2015-03-20 DIAGNOSIS — Z3403 Encounter for supervision of normal first pregnancy, third trimester: Secondary | ICD-10-CM

## 2015-03-20 DIAGNOSIS — Z1389 Encounter for screening for other disorder: Secondary | ICD-10-CM

## 2015-03-20 DIAGNOSIS — Z369 Encounter for antenatal screening, unspecified: Secondary | ICD-10-CM

## 2015-03-20 DIAGNOSIS — Z331 Pregnant state, incidental: Secondary | ICD-10-CM

## 2015-03-20 LAB — POCT URINALYSIS DIPSTICK
Glucose, UA: NEGATIVE
KETONES UA: NEGATIVE
Leukocytes, UA: NEGATIVE
Nitrite, UA: NEGATIVE
RBC UA: NEGATIVE

## 2015-03-20 LAB — OB RESULTS CONSOLE GC/CHLAMYDIA
Chlamydia: NEGATIVE
Gonorrhea: NEGATIVE

## 2015-03-20 NOTE — Progress Notes (Signed)
G1P0 [redacted]w[redacted]d Estimated Date of Delivery: 04/08/15  Blood pressure 100/80, pulse 80, weight 224 lb (101.606 kg), last menstrual period 07/02/2014.   BP weight and urine results all reviewed and noted.  Please refer to the obstetrical flow sheet for the fundal height and fetal heart rate documentation:  Patient reports good fetal movement, denies any bleeding and no rupture of membranes symptoms or regular contractions. Patient is without complaints. All questions were answered.  Plan:  Continued routine obstetrical care,   Follow up in 1 weeks for OB appointment,

## 2015-03-21 LAB — GC/CHLAMYDIA PROBE AMP
Chlamydia trachomatis, NAA: NEGATIVE
Neisseria gonorrhoeae by PCR: NEGATIVE

## 2015-03-25 LAB — CULTURE, BETA STREP (GROUP B ONLY): Strep Gp B Culture: NEGATIVE

## 2015-03-27 ENCOUNTER — Encounter: Payer: Self-pay | Admitting: Advanced Practice Midwife

## 2015-03-27 ENCOUNTER — Ambulatory Visit (INDEPENDENT_AMBULATORY_CARE_PROVIDER_SITE_OTHER): Payer: BC Managed Care – PPO | Admitting: Advanced Practice Midwife

## 2015-03-27 VITALS — BP 120/72 | HR 94 | Wt 222.0 lb

## 2015-03-27 DIAGNOSIS — Z1389 Encounter for screening for other disorder: Secondary | ICD-10-CM

## 2015-03-27 DIAGNOSIS — Z3403 Encounter for supervision of normal first pregnancy, third trimester: Secondary | ICD-10-CM

## 2015-03-27 DIAGNOSIS — Z331 Pregnant state, incidental: Secondary | ICD-10-CM

## 2015-03-27 LAB — POCT URINALYSIS DIPSTICK
Blood, UA: NEGATIVE
Glucose, UA: NEGATIVE
Ketones, UA: NEGATIVE
LEUKOCYTES UA: NEGATIVE
Nitrite, UA: NEGATIVE
PROTEIN UA: NEGATIVE

## 2015-03-27 NOTE — Progress Notes (Signed)
Pt denies any problems or concerns at this time.  

## 2015-03-27 NOTE — Progress Notes (Signed)
G1P0 [redacted]w[redacted]d Estimated Date of Delivery: 04/08/15  Blood pressure 120/72, pulse 94, weight 222 lb (100.699 kg), last menstrual period 07/02/2014.   BP weight and urine results all reviewed and noted.  Please refer to the obstetrical flow sheet for the fundal height and fetal heart rate documentation:  Patient reports good fetal movement, denies any bleeding and no rupture of membranes symptoms or regular contractions. Patient is without complaints. All questions were answered.  Plan:  Continued routine obstetrical care,   Follow up in 1 weeks for OB appointment,

## 2015-04-03 ENCOUNTER — Ambulatory Visit (INDEPENDENT_AMBULATORY_CARE_PROVIDER_SITE_OTHER): Payer: BC Managed Care – PPO | Admitting: Advanced Practice Midwife

## 2015-04-03 ENCOUNTER — Encounter: Payer: Self-pay | Admitting: Advanced Practice Midwife

## 2015-04-03 VITALS — BP 110/72 | HR 80 | Wt 225.5 lb

## 2015-04-03 DIAGNOSIS — Z1389 Encounter for screening for other disorder: Secondary | ICD-10-CM

## 2015-04-03 DIAGNOSIS — Z029 Encounter for administrative examinations, unspecified: Secondary | ICD-10-CM

## 2015-04-03 DIAGNOSIS — Z3403 Encounter for supervision of normal first pregnancy, third trimester: Secondary | ICD-10-CM

## 2015-04-03 DIAGNOSIS — Z331 Pregnant state, incidental: Secondary | ICD-10-CM

## 2015-04-03 LAB — POCT URINALYSIS DIPSTICK
Glucose, UA: NEGATIVE
Ketones, UA: NEGATIVE
NITRITE UA: NEGATIVE
RBC UA: NEGATIVE

## 2015-04-03 NOTE — Progress Notes (Signed)
G1P0 235w2d Estimated Date of Delivery: 04/08/15  Blood pressure 110/72, pulse 80, weight 225 lb 8 oz (102.286 kg), last menstrual period 07/02/2014.   BP weight and urine results all reviewed and noted.  Please refer to the obstetrical flow sheet for the fundal height and fetal heart rate documentation:  Patient reports good fetal movement, denies any bleeding and no rupture of membranes symptoms or regular contractions. Patient is without complaints. All questions were answered.  Plan:  Continued routine obstetrical care,   Follow up in 1 weeks for OB appointment,

## 2015-04-07 ENCOUNTER — Inpatient Hospital Stay (HOSPITAL_COMMUNITY)
Admission: AD | Admit: 2015-04-07 | Discharge: 2015-04-10 | DRG: 775 | Disposition: A | Discharge status: Home / Self Care | Payer: BC Managed Care – PPO | Source: Ambulatory Visit | Attending: Family Medicine | Admitting: Family Medicine

## 2015-04-07 ENCOUNTER — Encounter (HOSPITAL_COMMUNITY): Payer: Self-pay

## 2015-04-07 ENCOUNTER — Inpatient Hospital Stay (HOSPITAL_COMMUNITY): Payer: BC Managed Care – PPO | Admitting: Anesthesiology

## 2015-04-07 DIAGNOSIS — Z3A39 39 weeks gestation of pregnancy: Secondary | ICD-10-CM | POA: Diagnosis present

## 2015-04-07 DIAGNOSIS — Z3403 Encounter for supervision of normal first pregnancy, third trimester: Secondary | ICD-10-CM

## 2015-04-07 DIAGNOSIS — IMO0001 Reserved for inherently not codable concepts without codable children: Secondary | ICD-10-CM

## 2015-04-07 LAB — CBC
HCT: 38.7 % (ref 36.0–46.0)
HEMOGLOBIN: 12.9 g/dL (ref 12.0–15.0)
MCH: 28.4 pg (ref 26.0–34.0)
MCHC: 33.3 g/dL (ref 30.0–36.0)
MCV: 85.2 fL (ref 78.0–100.0)
PLATELETS: 202 10*3/uL (ref 150–400)
RBC: 4.54 MIL/uL (ref 3.87–5.11)
RDW: 13.5 % (ref 11.5–15.5)
WBC: 11 10*3/uL — AB (ref 4.0–10.5)

## 2015-04-07 LAB — AMNISURE RUPTURE OF MEMBRANE (ROM) NOT AT ARMC: Amnisure ROM: POSITIVE

## 2015-04-07 LAB — TYPE AND SCREEN
ABO/RH(D): O POS
Antibody Screen: NEGATIVE

## 2015-04-07 LAB — OB RESULTS CONSOLE GBS
GBS: POSITIVE
GBS: NEGATIVE

## 2015-04-07 LAB — ABO/RH: ABO/RH(D): O POS

## 2015-04-07 LAB — POCT FERN TEST: POCT FERN TEST: NEGATIVE

## 2015-04-07 MED ORDER — NALBUPHINE HCL 10 MG/ML IJ SOLN
5.0000 mg | INTRAMUSCULAR | Status: DC | PRN
  Administered 2015-04-07: 5 mg via INTRAVENOUS
  Filled 2015-04-07: qty 1

## 2015-04-07 MED ORDER — EPHEDRINE 5 MG/ML INJ
10.0000 mg | INTRAVENOUS | Status: DC | PRN
  Filled 2015-04-07: qty 2

## 2015-04-07 MED ORDER — FENTANYL 2.5 MCG/ML BUPIVACAINE 1/10 % EPIDURAL INFUSION (WH - ANES)
14.0000 mL/h | INTRAMUSCULAR | Status: DC | PRN
  Administered 2015-04-07: 14 mL/h via EPIDURAL
  Administered 2015-04-07: 12 mL/h via EPIDURAL
  Administered 2015-04-08: 14 mL/h via EPIDURAL
  Filled 2015-04-07 (×2): qty 125

## 2015-04-07 MED ORDER — LIDOCAINE HCL (PF) 1 % IJ SOLN
30.0000 mL | INTRAMUSCULAR | Status: DC | PRN
  Filled 2015-04-07: qty 30

## 2015-04-07 MED ORDER — LACTATED RINGERS IV SOLN
500.0000 mL | INTRAVENOUS | Status: DC | PRN
  Administered 2015-04-07: 500 mL via INTRAVENOUS

## 2015-04-07 MED ORDER — OXYTOCIN 40 UNITS IN LACTATED RINGERS INFUSION - SIMPLE MED
1.0000 m[IU]/min | INTRAVENOUS | Status: DC
  Administered 2015-04-07: 2 m[IU]/min via INTRAVENOUS
  Filled 2015-04-07: qty 1000

## 2015-04-07 MED ORDER — OXYTOCIN BOLUS FROM INFUSION: 500.0000 mL | INTRAVENOUS | Status: DC

## 2015-04-07 MED ORDER — BUPIVACAINE HCL (PF) 0.25 % IJ SOLN
INTRAMUSCULAR | Status: DC | PRN
  Administered 2015-04-07 (×2): 4 mL

## 2015-04-07 MED ORDER — DIPHENHYDRAMINE HCL 50 MG/ML IJ SOLN: 12.5000 mg | INTRAMUSCULAR | Status: DC | PRN

## 2015-04-07 MED ORDER — LIDOCAINE-EPINEPHRINE (PF) 2 %-1:200000 IJ SOLN
INTRAMUSCULAR | Status: DC | PRN
  Administered 2015-04-07: 4 mL

## 2015-04-07 MED ORDER — OXYCODONE-ACETAMINOPHEN 5-325 MG PO TABS: 1.0000 | ORAL_TABLET | ORAL | Status: DC | PRN

## 2015-04-07 MED ORDER — ACETAMINOPHEN 325 MG PO TABS: 650.0000 mg | ORAL_TABLET | ORAL | Status: DC | PRN

## 2015-04-07 MED ORDER — CITRIC ACID-SODIUM CITRATE 334-500 MG/5ML PO SOLN: 30.0000 mL | ORAL | Status: DC | PRN

## 2015-04-07 MED ORDER — TERBUTALINE SULFATE 1 MG/ML IJ SOLN: 0.2500 mg | Freq: Once | INTRAMUSCULAR | Status: AC | PRN

## 2015-04-07 MED ORDER — LACTATED RINGERS IV SOLN
INTRAVENOUS | Status: DC
  Administered 2015-04-07 (×2): via INTRAVENOUS

## 2015-04-07 MED ORDER — PHENYLEPHRINE 40 MCG/ML (10ML) SYRINGE FOR IV PUSH (FOR BLOOD PRESSURE SUPPORT)
80.0000 ug | PREFILLED_SYRINGE | INTRAVENOUS | Status: DC | PRN
Start: 2015-04-07 — End: 2015-04-08
  Filled 2015-04-07: qty 2
  Filled 2015-04-07: qty 20

## 2015-04-07 MED ORDER — OXYCODONE-ACETAMINOPHEN 5-325 MG PO TABS: 2.0000 | ORAL_TABLET | ORAL | Status: DC | PRN

## 2015-04-07 MED ORDER — ONDANSETRON HCL 4 MG/2ML IJ SOLN: 4.0000 mg | Freq: Four times a day (QID) | INTRAMUSCULAR | Status: DC | PRN

## 2015-04-07 MED ORDER — OXYTOCIN 40 UNITS IN LACTATED RINGERS INFUSION - SIMPLE MED: 62.5000 mL/h | INTRAVENOUS | Status: DC

## 2015-04-07 NOTE — H&P (Signed)
Krista Caldwell is a 34 y.o. female G1 at 3678w6d presenting for PROM  Maternal Medical History:  Reason for admission: Rupture of membranes.  Nausea. Pink bloody show since 04/06/15 at 1630. Mild contractions since about 0300  Contractions: Onset was 6-12 hours ago.   Frequency: irregular.   Duration is approximately 50 seconds.   Perceived severity is mild.    Fetal activity: Perceived fetal activity is normal.    Prenatal complications: No bleeding, PIH, preterm labor or substance abuse.   Prenatal Complications - Diabetes: none.   Pregnancy from IUI, essentially uncomplicated prenatal course.  OB History    Gravida Para Term Preterm AB TAB SAB Ectopic Multiple Living   1              Past Medical History  Diagnosis Date  . Asthma    History reviewed. No pertinent past surgical history. Family History: family history includes Asthma in her paternal grandmother; Dementia in her paternal grandfather; Heart disease in her maternal grandfather; Stroke in her maternal grandmother. Social History:  reports that she has never smoked. She has never used smokeless tobacco. She reports that she does not drink alcohol or use illicit drugs.   Prenatal Transfer Tool  Maternal Diabetes: No Genetic Screening: Normal Maternal Ultrasounds/Referrals: Normal Fetal Ultrasounds or other Referrals:  None Maternal Substance Abuse:  No Significant Maternal Medications:  None Significant Maternal Lab Results:  Lab values include: Group B Strep negative Other Comments:  None  Review of Systems  Constitutional: Negative for fever.  Respiratory: Negative for shortness of breath.   Cardiovascular: Negative for palpitations, orthopnea and leg swelling.  Gastrointestinal: Negative for heartburn, nausea, vomiting and abdominal pain.  Genitourinary: Negative for dysuria, hematuria and flank pain.  Skin: Negative for rash.  Neurological: Negative for headaches.  Psychiatric/Behavioral: Negative  for depression. The patient is not nervous/anxious.     Dilation: 1.5 Effacement (%): 80 Station: -2 Exam by:: Dorrene GermanJ. Lowe RN Blood pressure 134/89, pulse 79, temperature 98 F (36.7 C), temperature source Oral, resp. rate 18, height 5\' 7"  (1.702 m), weight 101.606 kg (224 lb), last menstrual period 07/02/2014, SpO2 98 %.  Filed Vitals:   04/07/15 0626 04/07/15 0831  BP: 121/71 134/89  Pulse: 96 79  Temp:  98 F (36.7 C)  TempSrc: Oral Oral  Resp: 18 18  Height: 5\' 7"  (1.702 m)   Weight: 101.606 kg (224 lb)   SpO2: 98%   My cervical exam: same as above, cephalic, mid-posterior  Maternal Exam:  Uterine Assessment: Contraction strength is mild.  Contraction frequency is irregular.   Abdomen: Patient reports no abdominal tenderness. Estimated fetal weight is EFW 7#.   Fetal presentation: vertex  Introitus: Normal vulva. Normal vagina.  Nitrazine test: not done. Amniotic fluid character: clear. Blood-tinged clear. Amnisure positive  Pelvis: adequate for delivery.   Cervix: Cervix evaluated by digital exam.     Fetal Exam Fetal Monitor Review: Mode: ultrasound.   Baseline rate: 135-140.  Variability: moderate (6-25 bpm).   Pattern: accelerations present and no decelerations.    Fetal State Assessment: Category I - tracings are normal.     Physical Exam  Nursing note and vitals reviewed. Constitutional: She is oriented to person, place, and time. She appears well-developed and well-nourished. No distress.  HENT:  Head: Normocephalic.  Eyes: Pupils are equal, round, and reactive to light. Left eye exhibits no discharge. No scleral icterus.  Neck: Normal range of motion. No thyromegaly present.  Cardiovascular: Normal rate, regular  rhythm and normal heart sounds.   No murmur heard. Respiratory: Effort normal and breath sounds normal.  GI: Soft. There is no tenderness.  Genitourinary: Vagina normal.                           Slight show   Musculoskeletal: Normal range of motion. She exhibits no edema.  Neurological: She is alert and oriented to person, place, and time. She has normal reflexes.  Skin: Skin is warm and dry.  Psychiatric: She has a normal mood and affect. Her behavior is normal. Thought content normal.    Prenatal labs: ABO, Rh: --/--/O POS (07/04 0849) Antibody: NEG (07/04 0849) Rubella: 1.96 (12/07 1630) RPR: Non Reactive (04/14 0912)  HBsAg: NEGATIVE (12/07 1630)  HIV: NONREACTIVE (12/07 1630)  GBS: Negative (07/04 1028)  2 hr GCT: 68/97/132  Assessment/Plan: G1 well-dated at [redacted]w[redacted]d PROM, favorable cx> Foley bulb placed   Krista Caldwell 04/07/2015, 10:41 AM

## 2015-04-07 NOTE — MAU Note (Signed)
Pt reports vaginal bleeding since yesterday, increasing this am. ? Contractions.

## 2015-04-07 NOTE — Progress Notes (Signed)
Patient ID: Lisette AbuElizabeth G Wiley, female   DOB: 11/09/1980, 34 y.o.   MRN: [id number] Lisette Abulizabeth G Corsello is a 34 y.o. G1P0 at 27108w6d admitted for SROM  Subjective: Comfortable. Still leaking; had a gush after voiding.  Objective: BP 120/76 mmHg  Pulse 81  Temp(Src) 97.9 F (36.6 C) (Oral)  Resp 20  Ht 5\' 7"  (1.702 m)  Wt 101.606 kg (224 lb)  BMI 35.08 kg/m2  SpO2 98%  LMP 07/02/2014 (Exact Date)  Fetal Heart FHR: 140 bpm, variability: moderate,  accelerations:  Present,  decelerations:  Absent   Contractions: irregular  SVE:   Dilation: 1.5 Effacement (%): 80 Station: -2 Exam by:: Dorrene GermanJ. Lowe RN  Assessment / Plan:  Labor: Cx ripening/IOL> await FB expulsion, then recheck cx Fetal Wellbeing: Cat 1 Pain Control:  n/a Expected mode of delivery: NSVD  Srihaan Mastrangelo 04/07/2015, 4:03 PM

## 2015-04-07 NOTE — Anesthesia Procedure Notes (Signed)
Epidural Patient location during procedure: OB  Staffing Anesthesiologist: Brittannie Tawney, CHRIS Performed by: anesthesiologist   Preanesthetic Checklist Completed: patient identified, surgical consent, pre-op evaluation, timeout performed, IV checked, risks and benefits discussed and monitors and equipment checked  Epidural Patient position: sitting Prep: site prepped and draped and DuraPrep Patient monitoring: heart rate, cardiac monitor, continuous pulse ox and blood pressure Approach: midline Location: L4-L5 Injection technique: LOR saline  Needle:  Needle type: Tuohy  Needle gauge: 17 G Needle length: 9 cm Needle insertion depth: 7 cm Catheter type: closed end flexible Catheter size: 19 Gauge Catheter at skin depth: 13 cm Test dose: negative and 2% lidocaine with Epi 1:200 K  Assessment Events: blood not aspirated, injection not painful, no injection resistance, negative IV test and no paresthesia  Additional Notes H+P and labs checked, risks and benefits discussed with the patient, consent obtained, procedure tolerated well and without complications.  Reason for block:procedure for pain   

## 2015-04-07 NOTE — Progress Notes (Signed)
Patient ID: Krista Caldwell, female   DOB: 01/15/1981, 34 y.o.   MRN: [id number] Krista Caldwell is a 34 y.o. G1P0 at 493w6d admitted for PROM  Subjective: Feeling UCs and requests IV analgesia.   Objective: BP 132/72 mmHg  Pulse 84  Temp(Src) 97.6 F (36.4 C) (Oral)  Resp 18  Ht 5\' 7"  (1.702 m)  Wt 101.606 kg (224 lb)  BMI 35.08 kg/m2  SpO2 98%  LMP 07/02/2014 (Exact Date)  Fetal Heart FHR: 135 bpm, variability: moderate,  accelerations:  Present,  decelerations:  Absent   Contractions: irregular, not tracing well> will adjust  SVE:   Dilation: 5 Effacement (%): 80, 90 Station: -2 Exam by:: D. Glee Lashomb, cnm Foley bulb was in vagina> removed  Assessment / Plan:  Labor: early Fetal Wellbeing: category 1 Pain Control:  Nubain given Expected mode of delivery: NSVD  [name] 04/07/2015, 7:52 PM

## 2015-04-07 NOTE — Anesthesia Preprocedure Evaluation (Signed)
Anesthesia Evaluation  Patient identified by MRN, date of birth, ID band Patient awake    Reviewed: Allergy & Precautions, NPO status , Patient's Chart, lab work & pertinent test results  History of Anesthesia Complications Negative for: history of anesthetic complications  Airway Mallampati: II  TM Distance: >3 FB Neck ROM: Full    Dental  (+) Teeth Intact   Pulmonary neg shortness of breath, asthma , neg recent URI,  breath sounds clear to auscultation        Cardiovascular negative cardio ROS  Rhythm:Regular     Neuro/Psych negative neurological ROS  negative psych ROS   GI/Hepatic negative GI ROS, Neg liver ROS,   Endo/Other  negative endocrine ROS  Renal/GU negative Renal ROS     Musculoskeletal   Abdominal   Peds  Hematology negative hematology ROS (+)   Anesthesia Other Findings   Reproductive/Obstetrics (+) Pregnancy                             Anesthesia Physical Anesthesia Plan  ASA: II  Anesthesia Plan: Epidural   Post-op Pain Management:    Induction:   Airway Management Planned:   Additional Equipment:   Intra-op Plan:   Post-operative Plan:   Informed Consent: I have reviewed the patients History and Physical, chart, labs and discussed the procedure including the risks, benefits and alternatives for the proposed anesthesia with the patient or authorized representative who has indicated his/her understanding and acceptance.   Dental advisory given  Plan Discussed with: Anesthesiologist  Anesthesia Plan Comments:         Anesthesia Quick Evaluation

## 2015-04-08 ENCOUNTER — Encounter (HOSPITAL_COMMUNITY): Payer: Self-pay | Admitting: *Deleted

## 2015-04-08 DIAGNOSIS — Z3A39 39 weeks gestation of pregnancy: Secondary | ICD-10-CM

## 2015-04-08 LAB — HIV ANTIBODY (ROUTINE TESTING W REFLEX): HIV SCREEN 4TH GENERATION: NONREACTIVE

## 2015-04-08 LAB — RPR: RPR Ser Ql: NONREACTIVE

## 2015-04-08 MED ORDER — MAGNESIUM HYDROXIDE 400 MG/5ML PO SUSP: 30.0000 mL | ORAL | Status: DC | PRN

## 2015-04-08 MED ORDER — SENNOSIDES-DOCUSATE SODIUM 8.6-50 MG PO TABS
2.0000 | ORAL_TABLET | ORAL | Status: DC
  Administered 2015-04-08 – 2015-04-09 (×2): 2 via ORAL
  Filled 2015-04-08 (×2): qty 2

## 2015-04-08 MED ORDER — LANOLIN HYDROUS EX OINT: 1.0000 "application " | TOPICAL_OINTMENT | CUTANEOUS | Status: DC | PRN

## 2015-04-08 MED ORDER — DIBUCAINE 1 % RE OINT: 1.0000 "application " | TOPICAL_OINTMENT | RECTAL | Status: DC | PRN

## 2015-04-08 MED ORDER — SIMETHICONE 80 MG PO CHEW: 80.0000 mg | CHEWABLE_TABLET | ORAL | Status: DC | PRN

## 2015-04-08 MED ORDER — ONDANSETRON HCL 4 MG/2ML IJ SOLN: 4.0000 mg | INTRAMUSCULAR | Status: DC | PRN

## 2015-04-08 MED ORDER — TETANUS-DIPHTH-ACELL PERTUSSIS 5-2.5-18.5 LF-MCG/0.5 IM SUSP
0.5000 mL | Freq: Once | INTRAMUSCULAR | Status: AC
  Administered 2015-04-09: 0.5 mL via INTRAMUSCULAR
  Filled 2015-04-08: qty 0.5

## 2015-04-08 MED ORDER — PRENATAL MULTIVITAMIN CH
1.0000 | ORAL_TABLET | Freq: Every day | ORAL | Status: DC
  Administered 2015-04-09: 1 via ORAL
  Filled 2015-04-08 (×2): qty 1

## 2015-04-08 MED ORDER — OXYCODONE-ACETAMINOPHEN 5-325 MG PO TABS
1.0000 | ORAL_TABLET | ORAL | Status: DC | PRN
  Administered 2015-04-08 – 2015-04-09 (×3): 1 via ORAL
  Filled 2015-04-08 (×3): qty 1

## 2015-04-08 MED ORDER — ZOLPIDEM TARTRATE 5 MG PO TABS: 5.0000 mg | ORAL_TABLET | Freq: Every evening | ORAL | Status: DC | PRN

## 2015-04-08 MED ORDER — DIPHENHYDRAMINE HCL 25 MG PO CAPS: 25.0000 mg | ORAL_CAPSULE | Freq: Four times a day (QID) | ORAL | Status: DC | PRN

## 2015-04-08 MED ORDER — WITCH HAZEL-GLYCERIN EX PADS
1.0000 "application " | MEDICATED_PAD | CUTANEOUS | Status: DC | PRN
  Administered 2015-04-08: 1 via TOPICAL

## 2015-04-08 MED ORDER — FERROUS SULFATE 325 (65 FE) MG PO TABS
325.0000 mg | ORAL_TABLET | Freq: Two times a day (BID) | ORAL | Status: DC
  Administered 2015-04-09 – 2015-04-10 (×3): 325 mg via ORAL
  Filled 2015-04-08 (×3): qty 1

## 2015-04-08 MED ORDER — BENZOCAINE-MENTHOL 20-0.5 % EX AERO
1.0000 "application " | INHALATION_SPRAY | CUTANEOUS | Status: DC | PRN
  Administered 2015-04-08: 1 via TOPICAL
  Filled 2015-04-08: qty 56

## 2015-04-08 MED ORDER — ONDANSETRON HCL 4 MG PO TABS: 4.0000 mg | ORAL_TABLET | ORAL | Status: DC | PRN

## 2015-04-08 MED ORDER — ACETAMINOPHEN 325 MG PO TABS
650.0000 mg | ORAL_TABLET | ORAL | Status: DC | PRN
  Administered 2015-04-08: 325 mg via ORAL
  Administered 2015-04-09 (×2): 650 mg via ORAL
  Administered 2015-04-09 (×2): 325 mg via ORAL
  Filled 2015-04-08 (×6): qty 2

## 2015-04-08 MED ORDER — MEASLES, MUMPS & RUBELLA VAC ~~LOC~~ INJ
0.5000 mL | INJECTION | Freq: Once | SUBCUTANEOUS | Status: DC
Start: 2015-04-09 — End: 2015-04-08
  Filled 2015-04-08: qty 0.5

## 2015-04-08 MED ORDER — OXYCODONE-ACETAMINOPHEN 5-325 MG PO TABS
2.0000 | ORAL_TABLET | ORAL | Status: DC | PRN
  Administered 2015-04-08: 2 via ORAL
  Filled 2015-04-08: qty 2

## 2015-04-08 NOTE — Progress Notes (Signed)
Patient ID: Krista Caldwell, female   DOB: 11/10/1980, 34 y.o.   MRN: [id number] Comfortable but itching  Filed Vitals:   04/08/15 0400 04/08/15 0430 04/08/15 0500 04/08/15 0540  BP: 135/92 114/58 122/69 116/78  Pulse: 90 83 90 75  Temp:   98 F (36.7 C)   TempSrc:   Oral   Resp:      Height:      Weight:      SpO2:       FHR reassuring UCs not tracing   Dilation: 7.5 Effacement (%): 100 Cervical Position: Posterior Station: -1 Presentation: Vertex Exam by:: Mayford KnifeWilliams, M., cnm  IUPC inserted  Dr Penne LashLeggett updated

## 2015-04-08 NOTE — Lactation Note (Signed)
This note was copied from the chart of Girl Lyn HollingsheadElizabeth Osmun. Lactation Consultation Note  Patient Name: Girl Lyn Hollingsheadlizabeth Waldren WUJWJ'XToday's Date: 04/08/2015 Reason for consult: Initial assessment  Baby 9 hours old. Mom reports that baby is sleepy and not wanting to nurse. Baby is being bathed at this time and room full of family taking pictures. Discussed with mom that while she is holding baby STS after bath, this is a good time to let baby nuzzle at breast and attempt to nurse. Mom states that she is seeing colostrum. Enc mom to hand express colostrum in baby's mouth and this will enc baby to open wide and achieve a deep latch. Enc mom to ask for assistance with latching, and mom states that "everyone has been great about helping with the baby." Discussed normal newborn behavior. Mom given [facility] broch[facility], aware of OP/BFSG, community resources, and [facility] phone line assistance after D/C. Maternal Data Has patient been taught Hand Expression?: Yes (Per mom.) Does the patient have breastfeeding experience prior to this delivery?: No  Feeding Feeding Type: Breast Fed Length of feed: 5 min  LATCH Score/Interventions                      Lactation Tools Discussed/Used     Consult Status Consult Status: Follow-up Date: 04/09/15 Follow-up type: In-patient    Geralynn OchsWILLIARD, Tarrie Mcmichen 04/08/2015, 8:54 PM

## 2015-04-08 NOTE — Anesthesia Postprocedure Evaluation (Signed)
Anesthesia Post Note  Patient: Krista Caldwell  Procedure(s) Performed: * No procedures listed *  Anesthesia type: Epidural  Patient location: Mother/Baby  Post pain: Pain level controlled  Post assessment: Post-op Vital signs reviewed  Last Vitals:  Filed Vitals:   04/08/15 1843  BP: 103/80  Pulse: 103  Temp: 36.7 C  Resp: 16    Post vital signs: Reviewed  Level of consciousness:alert  Complications: No apparent anesthesia complications

## 2015-04-08 NOTE — Progress Notes (Signed)
Patient ID: Krista Caldwell, female   DOB: 10/16/1980, 34 y.o.   MRN: [id number]   Krista Caldwell is a 34 y.o. G1P0 at 431w6d admitted for PROM  Subjective: Epidural placed  Objective: BP 120/76 mmHg  Pulse 72  Temp(Src) 97.8 F (36.6 C) (Oral)  Resp 18  Ht 5\' 7"  (1.702 m)  Wt 224 lb (101.606 kg)  BMI 35.08 kg/m2  SpO2 98%  LMP 07/02/2014 (Exact Date)  Fetal Heart FHR: 125 bpm, variability: moderate,  accelerations:  Present,  decelerations:  Absent   Contractions: irregular  SVE:   Dilation: 6 Effacement (%): 90 Station: -1 Exam by:: [email or web address]-[email or web address], rnc   Assessment / Plan:  Labor: augmenting with pitocin Fetal Wellbeing: category 1 Pain Control:  Epidural placed Expected mode of delivery: NSVD  Shirlee Latchngela Jisella Ashenfelter 04/08/2015, 1:57 AM

## 2015-04-09 ENCOUNTER — Telehealth: Payer: Self-pay | Admitting: *Deleted

## 2015-04-09 NOTE — Discharge Instructions (Signed)

## 2015-04-09 NOTE — Lactation Note (Signed)
This note was copied from the chart of Krista Caldwell. Lactation Consultation Note  Patient Name: Krista Caldwell ZOXWR'UToday's Date: 04/09/2015 Reason for consult: Follow-up assessment  Visited with Mom, baby 4423 hrs old.  Baby has been latching, but sleepy at the breast.  Mom has large areola, that are easily compressible, nipple small, and short shafted.  Manual breast expression taught with return demonstration done.  Colostrum easily expressed.  Encouraged Mom to use manual breast expression prior to latching.  Assisted with football hold.  Baby initially latched too shallow, and nipple pinched.  Assisted with positioning, hand placement, and baby was able to latch deeply, without discomfort.  Baby sucked/swallowed regularly.  Basic teaching shared with Mom.  Mom very appreciative of guidance offered.  Encouraged her to call prn for assistance.  To follow up in am prior to discharge.  Encouraged skin to skin and feeding often on cue. Consult Status Consult Status: Follow-up Date: 04/10/15 Follow-up type: In-patient    Judee ClaraSmith, Charizma Gardiner E 04/09/2015, 10:54 AM

## 2015-04-09 NOTE — Discharge Summary (Signed)
Obstetric Discharge Summary Reason for Admission: rupture of membranes Prenatal Procedures: none Intrapartum Procedures: spontaneous vaginal delivery Postpartum Procedures: none Complications-Operative and Postpartum: 2nd degree perineal laceration  Delivery Note At 11:08 AM a viable female was delivered via Vaginal, Spontaneous Delivery (Presentation: Left Occiput Anterior). APGAR: 9, 9; weight 6-13.  Placenta status: Intact, Spontaneous. Cord: 3 vessels with the following complications: None. Cord pH: NA.  Anesthesia: Epidural  Episiotomy: None Lacerations: 2nd degree;Sulcus Suture Repair: 3.0 vicryl rapide Est. Blood Loss (mL): 464 ml   Mom to postpartum. Baby to Couplet care / Skin to Skin. Placenta to: Birthing Suites Feeding: Breast Circ: NA Contraception: Undecided  Hospital Course:  Principal Problem:   Vaginal delivery Active Problems:   Active labor at term   Krista Caldwell is a 34 y.o. G1P1001 s/p SVD.  Patient was admitted 04/07/15.  She has postpartum course that was uncomplicated including no problems with ambulating, PO intake, urination, pain, or bleeding. The pt feels ready to go home and  will be discharged with outpatient follow-up.   Today: No acute events overnight.  Pt denies problems with ambulating, voiding or po intake.  She denies nausea or vomiting.  Pain is well controlled.  She has had flatus. She has not had bowel movement.  Lochia Moderate. Patient has decided not to use contraception stating that it was difficult for them to concieve originally and do not want to complicate things with birth control..  Method of Feeding: Breast  Physical Exam:  General: alert and cooperative Lochia: appropriate Uterine Fundus: firm DVT Evaluation: No evidence of DVT seen on physical exam.  H/H: Lab Results  Component Value Date/Time   HGB 12.9 04/07/2015 08:30 AM   HCT 38.7 04/07/2015 08:30 AM    Discharge Diagnoses: Term  Pregnancy-delivered  Discharge Information: Date: 04/09/2015 Activity: pelvic rest Diet: routine  Medications: None Breast feeding:  Yes Condition: stable Instructions: refer to handout Discharge to: home      Medication List    ASK your doctor about these medications        multivitamin-prenatal 27-0.8 MG Tabs tablet  Take 1 tablet by mouth daily at 12 noon.         Chana BodeNicholas E Willies Laviolette ,MD OB Fellow 04/09/2015,7:54 AM

## 2015-04-09 NOTE — Telephone Encounter (Signed)
Romeli with [employer] requesting patient date of delivering, vaginal or c-section, admission and discharge information.  Informed pt delivered SVD on 04/08/2015, admitted 04/07/2015, discharged 04/09/2015.

## 2015-04-10 ENCOUNTER — Encounter: Payer: BC Managed Care – PPO | Admitting: Obstetrics & Gynecology

## 2015-04-10 NOTE — Discharge Summary (Signed)
Obstetric Discharge Summary Reason for Admission: onset of labor Prenatal Procedures: none Intrapartum Procedures: spontaneous vaginal delivery Postpartum Procedures: none Complications-Operative and Postpartum: vaginal laceration 2nd degree  At 11:08 AM a viable female was delivered via Vaginal, Spontaneous Delivery (Presentation: Left Occiput Anterior). APGAR: 9, 9; weight 6-13.  Placenta status: Intact, Spontaneous. Cord: 3 vessels with the following complications: None. Cord pH: NA.  Hospital Course:  Principal Problem:   Vaginal delivery Active Problems:   Active labor at term   Krista Caldwell is a 34 y.o. G1P1001 s/p SVD.  Patient was admitted 07/04 for SROM.  She has postpartum course that was uncomplicated including no problems with ambulating, PO intake, urination, pain, or bleeding. The pt feels ready to go home and  will be discharged with outpatient follow-up.   Today: No acute events overnight.  Pt denies problems with ambulating, voiding or po intake.  She denies nausea or vomiting.  Pain is well controlled.  She has had flatus. She has not had bowel movement.  Lochia Small.  Plan for birth control is  no method.  Method of Feeding: Breast  Physical Exam:  General: alert and cooperative Lochia: appropriate Uterine Fundus: firm DVT Evaluation: No evidence of DVT seen on physical exam.  H/H: Lab Results  Component Value Date/Time   HGB 12.9 04/07/2015 08:30 AM   HCT 38.7 04/07/2015 08:30 AM    Discharge Diagnoses: Term Pregnancy-delivered  Discharge Information: Date: 04/10/2015 Activity: pelvic rest Diet: routine  Medications: PNV and Ibuprofen Breast feeding:  Yes Condition: stable Instructions: refer to handout Discharge to: home   Discharge Instructions    Call MD for:  redness, tenderness, or signs of infection (pain, swelling, redness, odor or green/yellow discharge around incision site)    Complete by:  As directed      Call MD for:   severe uncontrolled pain    Complete by:  As directed      Call MD for:  temperature >100.4    Complete by:  As directed      Diet - low sodium heart healthy    Complete by:  As directed             Medication List    TAKE these medications        multivitamin-prenatal 27-0.8 MG Tabs tablet  Take 1 tablet by mouth daily at 12 noon.           Follow-up Information    Follow up with [facility] In 5 weeks.      De Hollingsheadatherine L Wallace ,MD OB Fellow 04/10/2015,7:44 AM   CNM attestation I have seen and examined this patient and agree with above documentation in the resident's note.   Krista Caldwell is a 34 y.o. G1P1001 s/p SVD.   Pain is well controlled.  Plan for birth control is undecided.  Method of Feeding: breast  PE:  BP 117/61 mmHg  Pulse 78  Temp(Src) 98 F (36.7 C) (Oral)  Resp 16  Ht 5\' 7"  (1.702 m)  Wt 101.606 kg (224 lb)  BMI 35.08 kg/m2  SpO2 98%  LMP 07/02/2014 (Exact Date)  Breastfeeding? Unknown Fundus firm  No results for input(s): HGB, HCT in the last 72 hours.   Plan: discharge today - postpartum care discussed - f/u clinic in 6 weeks for postpartum visit   SHAW, Cala BradfordKIMBERLY, CNM 9:46 PM

## 2015-04-10 NOTE — Lactation Note (Signed)
This note was copied from the chart of Krista Lyn HollingsheadElizabeth Leising. Lactation Consultation Note  Patient Name: Krista Lyn Hollingsheadlizabeth Eid ZOXWR'UToday's Date: 04/10/2015 Reason for consult: Follow-up assessment Assisted Mom with positioning and obtaining more depth with latch. Demonstrated sandwiching of nipple/aerola, breast compression to help with latch due to Mom's short nipple shaft. Advised Mom baby should be at the breast 8-12 times in 24 hours and with feeding ques, cluster feeding reviewed. Advised to keep baby nursing for 15-30 minutes both breasts each feeding when possible. Engorgement care reviewed if needed. Advised of OP services and support group.   Maternal Data    Feeding Feeding Type: Breast Fed Length of feed: 10 min  LATCH Score/Interventions Latch: Grasps breast easily, tongue down, lips flanged, rhythmical sucking. Intervention(s): Adjust position;Assist with latch;Breast massage;Breast compression  Audible Swallowing: Spontaneous and intermittent  Type of Nipple: Everted at rest and after stimulation (short nipple shaft bilateral)  Comfort (Breast/Nipple): Soft / non-tender     Hold (Positioning): Assistance needed to correctly position infant at breast and maintain latch. Intervention(s): Breastfeeding basics reviewed;Support Pillows;Position options;Skin to skin  LATCH Score: 9  Lactation Tools Discussed/Used     Consult Status Consult Status: Complete Date: 04/10/15 Follow-up type: In-patient    Alfred LevinsGranger, Anniya Whiters Ann 04/10/2015, 9:13 AM

## 2015-05-19 ENCOUNTER — Encounter: Payer: Self-pay | Admitting: Women's Health

## 2015-05-19 ENCOUNTER — Ambulatory Visit (INDEPENDENT_AMBULATORY_CARE_PROVIDER_SITE_OTHER): Payer: BC Managed Care – PPO | Admitting: Women's Health

## 2015-05-19 NOTE — Patient Instructions (Signed)
Docusate sodium (colace) stool softener

## 2015-05-19 NOTE — Progress Notes (Signed)
Patient ID: Krista Caldwell, female   DOB: 1981/05/25, 34 y.o.   MRN: [id number] Subjective:    Krista Caldwell is a 34 y.o. G25P1001 Caucasian female who presents for a postpartum visit. She is 5 weeks postpartum following a spontaneous vaginal delivery at 40.0 gestational weeks. Anesthesia: epidural. I have fully reviewed the prenatal and intrapartum course. Postpartum course has been uncomplicated. Baby's course has been uncomplicated. Baby is feeding by breast. Bleeding no bleeding. Bowel function is normal. Bladder function is normal. Patient is not sexually active. Last sexual activity: prior to birth of baby. Contraception method is LAM, then none. Had to do IUI to conceive w/ this pregnancy, does want another baby. Postpartum depression screening: negative. Score 0.  Last pap 04/16/13 and was neg w/ -HRHPV.  The following portions of the patient's history were reviewed and updated as appropriate: allergies, current medications, past medical history, past surgical history and problem list.  Review of Systems Pertinent items are noted in HPI.   Filed Vitals:   05/19/15 0840  BP: 108/60  Pulse: 72  Weight: 210 lb (95.255 kg)   No LMP recorded.  Objective:   General:  alert, cooperative and no distress   Breasts:  deferred, no complaints  Lungs: clear to auscultation bilaterally  Heart:  regular rate and rhythm  Abdomen: soft, nontender   Vulva: normal  Vagina: normal vagina  Cervix:  closed  Corpus: Well-involuted  Adnexa:  Non-palpable  Rectal Exam: Small non-thrombosed external hemorrhoid        Assessment:   Postpartum exam 5 wks s/p SVB Breastfeeding Depression screening Contraception counseling   Plan:   Contraception: LAM then none, recommended waiting between babies Follow up in: July for pap & physical or earlier if needed  Marge Duncans CNM, [facility] 05/19/2015 8:47 AM

## 2017-11-03 ENCOUNTER — Ambulatory Visit: Payer: BC Managed Care – PPO | Admitting: Advanced Practice Midwife

## 2017-11-03 ENCOUNTER — Encounter: Payer: Self-pay | Admitting: Advanced Practice Midwife

## 2017-11-03 VITALS — BP 110/80 | HR 95 | Wt 230.0 lb

## 2017-11-03 DIAGNOSIS — O039 Complete or unspecified spontaneous abortion without complication: Secondary | ICD-10-CM | POA: Diagnosis not present

## 2017-11-03 NOTE — Progress Notes (Signed)
[facility] Visit  Patient name: Krista Caldwell MRN [id number]  Date of birth: 03/15/1981  CC & HPI:  Krista Caldwell is a 37 y.o. Caucasian female presenting today for f/u SAB.  Is seeing a fertility MD in [place], had a few rounds of IUI.  Some notes in Careeverywhere but no scans/labs. Pt has mychart and showed me her labs   Was on Femera and timed intercourse.1/7 dx w/failed pregnancy  hcg 10,931, but uterus only had sac  [id number] 1[facility]18 5003 1/23 3683  Had bleeding 1/04-03-13 or so.   Next HCG ordered for next week.  Would expect sharper decline at that point . Fertility MD is going to get labs/continue care.  Pt just wanted to make sure that there wasn't anything else that needed to be done.   Pertinent History Reviewed:  Medical & Surgical Hx:   Past Medical History:  Diagnosis Date  . Asthma    History reviewed. No pertinent surgical history. Family History  Problem Relation Age of Onset  . Stroke Maternal Grandmother   . Heart disease Maternal Grandfather        heart attack  . Asthma Paternal Grandmother   . Dementia Paternal Grandfather     Current Outpatient Medications:  .  Prenatal Vit-Fe Fumarate-FA (MULTIVITAMIN-PRENATAL) 27-0.8 MG TABS tablet, Take 1 tablet by mouth daily at 12 noon., Disp: , Rfl:  Social History: Reviewed -  reports that  has never smoked. she has never used smokeless tobacco.  Review of Systems:   Constitutional: Negative for fever and chills Eyes: Negative for visual disturbances Respiratory: Negative for shortness of breath, dyspnea Cardiovascular: Negative for chest pain or palpitations  Gastrointestinal: Negative for vomiting, diarrhea and constipation; no abdominal pain Genitourinary: Negative for dysuria and urgency, vaginal irritation or itching Musculoskeletal: Negative for back pain, joint pain, myalgias  Neurological: Negative for dizziness and headaches    Objective Findings:    Physical  Examination: General appearance - well appearing, and in no distress Mental status - alert, oriented to person, place, and time Chest:  Normal respiratory effort Heart - normal rate and regular rhythm Abdomen:  Soft, nontender Pelvic: deferred Musculoskeletal:  Normal range of motion without pain Extremities:  No edema    No results found for this or any previous visit (from the past 24 hour(s)).    Assessment & Plan:  A:   SP SAB, declining HCGs P:  hcg next week as scheduled.  Be sure to talk w/ordering MD about results   No Follow-up on file.  CRESENZO-DISHMAN,Gicela Schwarting CNM 11/03/2017 4:40 PM

## 2017-11-23 ENCOUNTER — Encounter: Payer: Self-pay | Admitting: Adult Health

## 2017-11-23 ENCOUNTER — Other Ambulatory Visit: Payer: Self-pay

## 2017-11-23 ENCOUNTER — Ambulatory Visit: Payer: BC Managed Care – PPO | Admitting: Adult Health

## 2017-11-23 ENCOUNTER — Other Ambulatory Visit (HOSPITAL_COMMUNITY)
Admission: RE | Admit: 2017-11-23 | Discharge: 2017-11-23 | Disposition: A | Discharge status: Home / Self Care | Payer: BC Managed Care – PPO | Source: Ambulatory Visit | Attending: Adult Health | Admitting: Adult Health

## 2017-11-23 VITALS — BP 114/76 | HR 92 | Resp 18 | Ht 66.5 in | Wt 232.0 lb

## 2017-11-23 DIAGNOSIS — Z3169 Encounter for other general counseling and advice on procreation: Secondary | ICD-10-CM | POA: Diagnosis not present

## 2017-11-23 DIAGNOSIS — Z319 Encounter for procreative management, unspecified: Secondary | ICD-10-CM

## 2017-11-23 DIAGNOSIS — Z01411 Encounter for gynecological examination (general) (routine) with abnormal findings: Secondary | ICD-10-CM | POA: Diagnosis not present

## 2017-11-23 DIAGNOSIS — Z01419 Encounter for gynecological examination (general) (routine) without abnormal findings: Secondary | ICD-10-CM | POA: Diagnosis present

## 2017-11-23 DIAGNOSIS — Z8759 Personal history of other complications of pregnancy, childbirth and the puerperium: Secondary | ICD-10-CM | POA: Diagnosis not present

## 2017-11-23 NOTE — Progress Notes (Signed)
Patient ID: Krista Caldwell, female   DOB: 10/13/1980, 37 y.o.   MRN: [id number] History of Present Illness: Krista Caldwell is a 37 year old white female,married, in for well woman gyn exam and pap.She had miscarriage in January after having had IUI 5 times, she was [place] Morrison [place]rey De[place].She had labs today. She would to be pregnant again in near future.    Current Medications, Allergies, Past Medical History, Past Surgical History, Family History and Social History were reviewed in [facility] electronic medical record.     Review of Systems:  Patient denies any headaches, hearing loss, fatigue, blurred vision, shortness of breath, chest pain, abdominal pain, problems with bowel movements, urination, or intercourse. No joint pain or mood swings.   Physical Exam:BP 114/76 (BP Location: Right Arm, Patient Position: Sitting, Cuff Size: Large)   Pulse 92   Resp 18   Ht 5' 6.5" (1.689 m)   Wt 232 lb (105.2 kg)   LMP 11/20/2017   Breastfeeding? No   BMI 36.88 kg/m  General:  Well developed, well nourished, no acute distress Skin:  Warm and dry Neck:  Midline trachea, normal thyroid, good ROM, no lymphadenopathy Lungs; Clear to auscultation bilaterally Breast:  No dominant palpable mass, retraction, or nipple discharge Cardiovascular: Regular rate and rhythm Abdomen:  Soft, non tender, no hepatosplenomegaly Pelvic:  External genitalia is normal in appearance, no lesions.  The vagina is normal in appearance,+blood. Urethra has no lesions or masses. The cervix is bulbous. Pap with HPV performed. Uterus is felt to be normal size, shape, and contour.  No adnexal masses or tenderness noted.Bladder is non tender, no masses felt. Extremities/musculoskeletal:  No swelling or varicosities noted, no clubbing or cyanosis Psych:  No mood changes, alert and cooperative,seems happy PHQ 9 score 0. Discussed timing of sex, if wants to try again on her own soon.  Impression: 1. Encounter  for gynecological examination with Papanicolaou smear of cervix   2. History of miscarriage   3. Patient desires pregnancy       Plan: Physical in 1 year Pap in 3 if normal

## 2017-11-28 LAB — CYTOLOGY - PAP
Adequacy: ABSENT
DIAGNOSIS: NEGATIVE
HPV: NOT DETECTED

## 2019-09-13 ENCOUNTER — Other Ambulatory Visit: Payer: Self-pay | Admitting: Obstetrics & Gynecology

## 2019-09-13 DIAGNOSIS — Z3682 Encounter for antenatal screening for nuchal translucency: Secondary | ICD-10-CM

## 2019-09-14 ENCOUNTER — Ambulatory Visit (INDEPENDENT_AMBULATORY_CARE_PROVIDER_SITE_OTHER): Payer: BC Managed Care – PPO

## 2019-09-14 ENCOUNTER — Encounter: Payer: Self-pay | Admitting: Women's Health

## 2019-09-14 ENCOUNTER — Ambulatory Visit (INDEPENDENT_AMBULATORY_CARE_PROVIDER_SITE_OTHER): Payer: BC Managed Care – PPO | Admitting: Women's Health

## 2019-09-14 ENCOUNTER — Other Ambulatory Visit: Payer: Self-pay

## 2019-09-14 ENCOUNTER — Ambulatory Visit: Payer: BC Managed Care – PPO | Admitting: *Deleted

## 2019-09-14 VITALS — BP 125/79 | HR 105 | Wt 224.0 lb

## 2019-09-14 DIAGNOSIS — Z3682 Encounter for antenatal screening for nuchal translucency: Secondary | ICD-10-CM | POA: Diagnosis not present

## 2019-09-14 DIAGNOSIS — Z363 Encounter for antenatal screening for malformations: Secondary | ICD-10-CM

## 2019-09-14 DIAGNOSIS — Z3A12 12 weeks gestation of pregnancy: Secondary | ICD-10-CM | POA: Diagnosis not present

## 2019-09-14 DIAGNOSIS — E039 Hypothyroidism, unspecified: Secondary | ICD-10-CM

## 2019-09-14 DIAGNOSIS — O219 Vomiting of pregnancy, unspecified: Secondary | ICD-10-CM

## 2019-09-14 DIAGNOSIS — O99281 Endocrine, nutritional and metabolic diseases complicating pregnancy, first trimester: Secondary | ICD-10-CM

## 2019-09-14 DIAGNOSIS — Z0283 Encounter for blood-alcohol and blood-drug test: Secondary | ICD-10-CM

## 2019-09-14 DIAGNOSIS — Z1389 Encounter for screening for other disorder: Secondary | ICD-10-CM

## 2019-09-14 DIAGNOSIS — Z113 Encounter for screening for infections with a predominantly sexual mode of transmission: Secondary | ICD-10-CM

## 2019-09-14 DIAGNOSIS — Z331 Pregnant state, incidental: Secondary | ICD-10-CM

## 2019-09-14 DIAGNOSIS — Z3490 Encounter for supervision of normal pregnancy, unspecified, unspecified trimester: Secondary | ICD-10-CM | POA: Insufficient documentation

## 2019-09-14 DIAGNOSIS — Z3481 Encounter for supervision of other normal pregnancy, first trimester: Secondary | ICD-10-CM

## 2019-09-14 MED ORDER — BONJESTA 20-20 MG PO TBCR: 1.0000 | EXTENDED_RELEASE_TABLET | Freq: Every day | ORAL | 8 refills | Status: DC

## 2019-09-14 NOTE — Progress Notes (Signed)
[place] 12+6 wks,measurements c/w dates,posterior placenta,normal ovaries,crl 60.49 mm,NB present,NT 1.6 mm,fhr 167 bpm,

## 2019-09-14 NOTE — Progress Notes (Signed)
INITIAL OBSTETRICAL VISIT Patient name: Krista Caldwell MRN [id number]  Date of birth: Nov 01, 1980 Chief Complaint:   Initial Prenatal Visit  History of Present Illness:   Krista Caldwell is a 38 y.o. G21P1011 Caucasian female at [redacted]w[redacted]d by 7wk u/s w/ [facility] fertility, with an Estimated Date of Delivery: 03/22/20 being seen today for her initial obstetrical visit.   Her obstetrical history is significant for term uncomplicated SVB x 1, SAB x1. Has required IUI w/ this pregnancy as well as the last. IUI 07/02/19 w/ [facility].  Today she reports nausea-requests meds.  No LMP recorded. Patient is pregnant. Last pap 11/23/17. Results were: normal Review of Systems:   Pertinent items are noted in HPI Denies cramping/contractions, leakage of fluid, vaginal bleeding, abnormal vaginal discharge w/ itching/odor/irritation, headaches, visual changes, shortness of breath, chest pain, abdominal pain, severe nausea/vomiting, or problems with urination or bowel movements unless otherwise stated above.  Pertinent History Reviewed:  Reviewed past medical,surgical, social, obstetrical and family history.  Reviewed problem list, medications and allergies. OB History  Gravida Para Term Preterm AB Living  3 1 1   1 1   SAB TAB Ectopic Multiple Live Births  1 0   0 1    # Outcome Date GA Lbr Len/2nd Weight Sex Delivery Anes PTL Lv  3 Current           2 SAB 10/10/17          1 Term 04/08/15 [redacted]w[redacted]d / 00:44 6 lb 13.2 oz (3.096 kg) F Vag-Spont EPI  LIV   Physical Assessment:   Vitals:   09/14/19 1009  BP: 125/79  Pulse: (!) 105  Weight: 224 lb (101.6 kg)  Body mass index is 35.61 kg/m.       Physical Examination:  General appearance - well appearing, and in no distress  Mental status - alert, oriented to person, place, and time  Psych:  She has a normal mood and affect  Skin - warm and dry, normal color, no suspicious lesions noted  Chest - effort normal, all lung fields clear to auscultation  bilaterally  Heart - normal rate and regular rhythm  Abdomen - soft, nontender  Extremities:  No swelling or varicosities noted  Thin prep pap is not done  TODAY'S NT 14/11/20 12+6 wks,measurements c/w dates,posterior placenta,normal ovaries,crl 60.49 mm,NB present,NT 1.6 mm,fhr 167 bpm  No results found for this or any previous visit (from the past 24 hour(s)).  Assessment & Plan:  1) Low-Risk Pregnancy G3P1011 at [redacted]w[redacted]d with an Estimated Date of Delivery: 03/22/20   2) Initial OB visit  3) Nausea> [facility] Bon[facility] [place]  4) Hypothyroidism> on synthroid 03/24/20, check TSH today  Meds: No orders of the defined types were placed in this encounter.   Initial labs obtained Continue prenatal vitamins Reviewed n/v relief measures and warning s/s to report Reviewed recommended weight gain based on pre-gravid BMI Encouraged well-balanced diet Genetic Screening discussed: requested nt/it, declined maternit21 Cystic fibrosis, SMA, Fragile X screening discussed declined Ultrasound discussed; fetal survey: requested CCNC completed>PCM not here, form faxed>not faxed, not applying for preg mcaid The nature of [facility] for with multiple MDs and other Advanced Practice Providers was explained to patient; also emphasized that fellows, residents, and students are part of our team. Has home bp cuff.  Check bp weekly, let [employer] know if >140/90.    Follow-up: Return in about 5 weeks (around 10/19/2019) for LROB, 2nd IT, 10/21/2019,  in person, CNM.   Orders Placed This Encounter  Procedures  . Urine Culture  . GC/Chlamydia Probe Amp  . US OB Comp + 14 Wk  . Pain Management Screening Profile (10S)  . Obstetric Panel, Including HIV  . Integrated 1  . TSH  . POC Urinalysis Dipstick OB    Roma Schanz CNM, [facility] 09/14/2019 11:09 AM

## 2019-09-14 NOTE — Patient Instructions (Signed)
Krista Caldwell, I greatly value your feedback.  If you receive a survey following your visit with [place] today, we appreciate you taking the time to fill it out.  Thanks, Krista Caldwell, CNM, [facility]  [place]!!! It is now [place] at [facility] ([place], [place] [id number]) Entrance located off of [address] parking   Nausea & Vomiting  Have saltine crackers or pretzels by your bed and eat a few bites before you raise your head out of bed in the morning  Eat small frequent meals throughout the day instead of large meals  Drink plenty of fluids throughout the day to stay hydrated, just don't drink a lot of fluids with your meals.  This can make your stomach fill up faster making you feel sick  Do not brush your teeth right after you eat  Products with real ginger are good for nausea, like ginger ale and ginger hard candy Make sure it says made with real ginger!  Sucking on sour candy like lemon heads is also good for nausea  If your prenatal vitamins make you nauseated, take them at night so you will sleep through the nausea  Sea Bands  If you feel like you need medicine for the nausea & vomiting please let us know  If you are unable to keep any fluids or food down please let us know   Constipation  Drink plenty of fluid, preferably water, throughout the day  Eat foods high in fiber such as fruits, vegetables, and grains  Exercise, such as walking, is a good way to keep your bowels regular  Drink warm fluids, especially warm prune juice, or decaf coffee  Eat a 1/2 cup of real oatmeal (not instant), 1/2 cup applesauce, and 1/2-1 cup warm prune juice every day  If needed, you may take Colace (docusate sodium) stool softener once or twice a day to help keep the stool soft.   If you still are having problems with constipation, you may take Miralax once daily as needed to help keep your bowels regular.   Home  Blood Pressure Monitoring for Patients   Your provider has recommended that you check your blood pressure (BP) at least once a week at home. If you do not have a blood pressure cuff at home, one will be provided for you. Contact your provider if you have not received your monitor within 1 week.   Helpful Tips for Accurate Home Blood Pressure Checks  . Don't smoke, exercise, or drink caffeine 30 minutes before checking your BP . Use the restroom before checking your BP (a full bladder can raise your pressure) . Relax in a comfortable upright chair . Feet on the ground . Left arm resting comfortably on a flat surface at the level of your heart . Legs uncrossed . Back supported . Sit quietly and don't talk . Place the cuff on your bare arm . Adjust snuggly, so that only two fingertips can fit between your skin and the top of the cuff . Check 2 readings separated by at least one minute . Keep a log of your BP readings . For a visual, please reference this diagram: http://ccnc.care/bpdiagram  Provider Name: [facility]     Phone: [phone]  Zone 1: ALL CLEAR  Continue to monitor your symptoms:  . BP reading is less than 140 (top number) or less than 90 (bottom number)  . No right upper stomach  pain . No headaches or seeing spots . No feeling nauseated or throwing up . No swelling in face and hands  Zone 2: CAUTION Call your doctor's office for any of the following:  . BP reading is greater than 140 (top number) or greater than 90 (bottom number)  . Stomach pain under your ribs in the middle or right side . Headaches or seeing spots . Feeling nauseated or throwing up . Swelling in face and hands  Zone 3: EMERGENCY  Seek immediate medical care if you have any of the following:  . BP reading is greater than160 (top number) or greater than 110 (bottom number) . Severe headaches not improving with Tylenol . Serious difficulty catching your breath . Any worsening symptoms  from Zone 2    First Trimester of Pregnancy The first trimester of pregnancy is from week 1 until the end of week 12 (months 1 through 3). A week after a sperm fertilizes an egg, the egg will implant on the wall of the uterus. This embryo will begin to develop into a baby. Genes from you and your partner are forming the baby. The female genes determine whether the baby is a boy or a girl. At 6-8 weeks, the eyes and face are formed, and the heartbeat can be seen on ultrasound. At the end of 12 weeks, all the baby's organs are formed.  Now that you are pregnant, you will want to do everything you can to have a healthy baby. Two of the most important things are to get good prenatal care and to follow your health care provider's instructions. Prenatal care is all the medical care you receive before the baby's birth. This care will help prevent, find, and treat any problems during the pregnancy and childbirth. BODY CHANGES Your body goes through many changes during pregnancy. The changes vary from woman to woman.   You may gain or lose a couple of pounds at first.  You may feel sick to your stomach (nauseous) and throw up (vomit). If the vomiting is uncontrollable, call your health care provider.  You may tire easily.  You may develop headaches that can be relieved by medicines approved by your health care provider.  You may urinate more often. Painful urination may mean you have a bladder infection.  You may develop heartburn as a result of your pregnancy.  You may develop constipation because certain hormones are causing the muscles that push waste through your intestines to slow down.  You may develop hemorrhoids or swollen, bulging veins (varicose veins).  Your breasts may begin to grow larger and become tender. Your nipples may stick out more, and the tissue that surrounds them (areola) may become darker.  Your gums may bleed and may be sensitive to brushing and flossing.  Dark spots or  blotches (chloasma, mask of pregnancy) may develop on your face. This will likely fade after the baby is born.  Your menstrual periods will stop.  You may have a loss of appetite.  You may develop cravings for certain kinds of food.  You may have changes in your emotions from day to day, such as being excited to be pregnant or being concerned that something may go wrong with the pregnancy and baby.  You may have more vivid and strange dreams.  You may have changes in your hair. These can include thickening of your hair, rapid growth, and changes in texture. Some women also have hair loss during or after pregnancy, or hair that  feels dry or thin. Your hair will most likely return to normal after your baby is born. WHAT TO EXPECT AT YOUR PRENATAL VISITS During a routine prenatal visit:  You will be weighed to make sure you and the baby are growing normally.  Your blood pressure will be taken.  Your abdomen will be measured to track your baby's growth.  The fetal heartbeat will be listened to starting around week 10 or 12 of your pregnancy.  Test results from any previous visits will be discussed. Your health care provider may ask you:  How you are feeling.  If you are feeling the baby move.  If you have had any abnormal symptoms, such as leaking fluid, bleeding, severe headaches, or abdominal cramping.  If you have any questions. Other tests that may be performed during your first trimester include:  Blood tests to find your blood type and to check for the presence of any previous infections. They will also be used to check for low iron levels (anemia) and Rh antibodies. Later in the pregnancy, blood tests for diabetes will be done along with other tests if problems develop.  Urine tests to check for infections, diabetes, or protein in the urine.  An ultrasound to confirm the proper growth and development of the baby.  An amniocentesis to check for possible genetic problems.   Fetal screens for spina bifida and Down syndrome.  You may need other tests to make sure you and the baby are doing well. HOME CARE INSTRUCTIONS  Medicines  Follow your health care provider's instructions regarding medicine use. Specific medicines may be either safe or unsafe to take during pregnancy.  Take your prenatal vitamins as directed.  If you develop constipation, try taking a stool softener if your health care provider approves. Diet  Eat regular, well-balanced meals. Choose a variety of foods, such as meat or vegetable-based protein, fish, milk and low-fat dairy products, vegetables, fruits, and whole grain breads and cereals. Your health care provider will help you determine the amount of weight gain that is right for you.  Avoid raw meat and uncooked cheese. These carry germs that can cause birth defects in the baby.  Eating four or five small meals rather than three large meals a day may help relieve nausea and vomiting. If you start to feel nauseous, eating a few soda crackers can be helpful. Drinking liquids between meals instead of during meals also seems to help nausea and vomiting.  If you develop constipation, eat more high-fiber foods, such as fresh vegetables or fruit and whole grains. Drink enough fluids to keep your urine clear or pale yellow. Activity and Exercise  Exercise only as directed by your health care provider. Exercising will help you:  Control your weight.  Stay in shape.  Be prepared for labor and delivery.  Experiencing pain or cramping in the lower abdomen or low back is a good sign that you should stop exercising. Check with your health care provider before continuing normal exercises.  Try to avoid standing for long periods of time. Move your legs often if you must stand in one place for a long time.  Avoid heavy lifting.  Wear low-heeled shoes, and practice good posture.  You may continue to have sex unless your health care provider  directs you otherwise. Relief of Pain or Discomfort  Wear a good support bra for breast tenderness.    Take warm sitz baths to soothe any pain or discomfort caused by hemorrhoids. Use hemorrhoid cream  if your health care provider approves.    Rest with your legs elevated if you have leg cramps or low back pain.  If you develop varicose veins in your legs, wear support hose. Elevate your feet for 15 minutes, 3-4 times a day. Limit salt in your diet. Prenatal Care  Schedule your prenatal visits by the twelfth week of pregnancy. They are usually scheduled monthly at first, then more often in the last 2 months before delivery.  Write down your questions. Take them to your prenatal visits.  Keep all your prenatal visits as directed by your health care provider. Safety  Wear your seat belt at all times when driving.  Make a list of emergency phone numbers, including numbers for family, friends, the hospital, and police and fire departments. General Tips  Ask your health care provider for a referral to a local prenatal education class. Begin classes no later than at the beginning of month 6 of your pregnancy.  Ask for help if you have counseling or nutritional needs during pregnancy. Your health care provider can offer advice or refer you to specialists for help with various needs.  Do not use hot tubs, steam rooms, or saunas.  Do not douche or use tampons or scented sanitary pads.  Do not cross your legs for long periods of time.  Avoid cat litter boxes and soil used by cats. These carry germs that can cause birth defects in the baby and possibly loss of the fetus by miscarriage or stillbirth.  Avoid all smoking, herbs, alcohol, and medicines not prescribed by your health care provider. Chemicals in these affect the formation and growth of the baby.  Schedule a dentist appointment. At home, brush your teeth with a soft toothbrush and be gentle when you floss. SEEK MEDICAL CARE IF:    You have dizziness.  You have mild pelvic cramps, pelvic pressure, or nagging pain in the abdominal area.  You have persistent nausea, vomiting, or diarrhea.  You have a bad smelling vaginal discharge.  You have pain with urination.  You notice increased swelling in your face, hands, legs, or ankles. SEEK IMMEDIATE MEDICAL CARE IF:   You have a fever.  You are leaking fluid from your vagina.  You have spotting or bleeding from your vagina.  You have severe abdominal cramping or pain.  You have rapid weight gain or loss.  You vomit blood or material that looks like coffee grounds.  You are exposed to [place] measles and have never had them.  You are exposed to fifth disease or chickenpox.  You develop a severe headache.  You have shortness of breath.  You have any kind of trauma, such as from a fall or a car accident. Document Released: 09/14/2001 Document Revised: 02/04/2014 Document Reviewed: 07/31/2013 [employer] Patient Information 2015 [place], [place]. This information is not intended to replace advice given to you by your health care provider. Make sure you discuss any questions you have with your health care provider.  Coronavirus (COVID-19) Are you at risk?  Are you at risk for the Coronavirus (COVID-19)?  To be considered HIGH RISK for Coronavirus (COVID-19), you have to meet the following criteria:  . Traveled to [place], [place], [place], [place] or [place]; or in the [place] to [place], [place], [place], or [place]; and have fever, cough, and shortness of breath within the last 2 weeks of travel OR . Been in close contact with a person diagnosed with COVID-19 within the last 2  weeks and have fever, cough, and shortness of breath . IF YOU DO NOT MEET THESE CRITERIA, YOU ARE CONSIDERED LOW RISK FOR COVID-19.  What to do if you are HIGH RISK for COVID-19?  Marland Kitchen If you are having a medical emergency, call 911. . Seek medical care right away. Before  you go to a doctor's office, urgent care or emergency department, call ahead and tell them about your recent travel, contact with someone diagnosed with COVID-19, and your symptoms. You should receive instructions from your physician's office regarding next steps of care.  . When you arrive at healthcare provider, tell the healthcare staff immediately you have returned from visiting [place], [place], [place], [place] or [place]; or traveled in the [place] to [place], [place], [place], or [place]; in the last two weeks or you have been in close contact with a person diagnosed with COVID-19 in the last 2 weeks.   . Tell the health care staff about your symptoms: fever, cough and shortness of breath. . After you have been seen by a medical provider, you will be either: o Tested for (COVID-19) and discharged home on quarantine except to seek medical care if symptoms worsen, and asked to  - Stay home and avoid contact with others until you get your results (4-5 days)  - Avoid travel on public transportation if possible (such as bus, train, or airplane) or o Sent to the Emergency Department by EMS for evaluation, COVID-19 testing, and possible admission depending on your condition and test results.  What to do if you are LOW RISK for COVID-19?  Reduce your risk of any infection by using the same precautions used for avoiding the common cold or flu:  Marland Kitchen Wash your hands often with soap and warm water for at least 20 seconds.  If soap and water are not readily available, use an alcohol-based hand sanitizer with at least 60% alcohol.  . If coughing or sneezing, cover your mouth and nose by coughing or sneezing into the elbow areas of your shirt or coat, into a tissue or into your sleeve (not your hands). . Avoid shaking hands with others and consider head nods or verbal greetings only. . Avoid touching your eyes, nose, or mouth with unwashed hands.  . Avoid close contact with people who are sick. .  Avoid places or events with large numbers of people in one location, like concerts or sporting events. . Carefully consider travel plans you have or are making. . If you are planning any travel outside or inside the [place], visit the CDC's Travelers' Health webpage for the latest health notices. . If you have some symptoms but not all symptoms, continue to monitor at home and seek medical attention if your symptoms worsen. . If you are having a medical emergency, call 911.   [place] / e-Visit: [email or web address]         [facility]: [facility]: [id number]                   [facility]: [phone]

## 2019-09-15 LAB — OBSTETRIC PANEL, INCLUDING HIV
Antibody Screen: NEGATIVE
Basophils Absolute: 0.1 10*3/uL (ref 0.0–0.2)
Basos: 1 %
EOS (ABSOLUTE): 0.1 10*3/uL (ref 0.0–0.4)
Eos: 2 %
HIV Screen 4th Generation wRfx: NONREACTIVE
Hematocrit: 43.1 % (ref 34.0–46.6)
Hemoglobin: 14.6 g/dL (ref 11.1–15.9)
Hepatitis B Surface Ag: NEGATIVE
Immature Grans (Abs): 0 10*3/uL (ref 0.0–0.1)
Immature Granulocytes: 0 %
Lymphocytes Absolute: 2.3 10*3/uL (ref 0.7–3.1)
Lymphs: 30 %
MCH: 29.7 pg (ref 26.6–33.0)
MCHC: 33.9 g/dL (ref 31.5–35.7)
MCV: 88 fL (ref 79–97)
Monocytes Absolute: 0.5 10*3/uL (ref 0.1–0.9)
Monocytes: 6 %
Neutrophils Absolute: 4.7 10*3/uL (ref 1.4–7.0)
Neutrophils: 61 %
Platelets: 176 10*3/uL (ref 150–450)
RBC: 4.92 x10E6/uL (ref 3.77–5.28)
RDW: 12.8 % (ref 11.7–15.4)
RPR Ser Ql: NONREACTIVE
Rh Factor: POSITIVE
Rubella Antibodies, IGG: 3.3 index (ref >0.99)
WBC: 7.6 10*3/uL (ref 3.4–10.8)

## 2019-09-15 LAB — MED LIST OPTION NOT SELECTED

## 2019-09-15 LAB — TSH: TSH: 0.956 u[IU]/mL (ref 0.450–4.500)

## 2019-09-16 LAB — URINE CULTURE: Organism ID, Bacteria: NO GROWTH

## 2019-09-16 LAB — GC/CHLAMYDIA PROBE AMP
Chlamydia trachomatis, NAA: NEGATIVE
Neisseria Gonorrhoeae by PCR: NEGATIVE

## 2019-09-17 LAB — PMP SCREEN PROFILE (10S), URINE
Amphetamine Scrn, Ur: NEGATIVE ng/mL
BARBITURATE SCREEN URINE: NEGATIVE ng/mL
BENZODIAZEPINE SCREEN, URINE: NEGATIVE ng/mL
CANNABINOIDS UR QL SCN: NEGATIVE ng/mL
Cocaine (Metab) Scrn, Ur: NEGATIVE ng/mL
Creatinine(Crt), U: 166.3 mg/dL (ref 20.0–300.0)
Methadone Screen, Urine: NEGATIVE ng/mL
OXYCODONE+OXYMORPHONE UR QL SCN: NEGATIVE ng/mL
Opiate Scrn, Ur: NEGATIVE ng/mL
Ph of Urine: 5.6 (ref 4.5–8.9)
Phencyclidine Qn, Ur: NEGATIVE ng/mL
Propoxyphene Scrn, Ur: NEGATIVE ng/mL

## 2019-09-18 LAB — INTEGRATED 1
Crown Rump Length: 60.5 mm
Gest. Age on Collection Date: 12.3 weeks
Maternal Age at EDD: 38.1 yr
Nuchal Translucency (NT): 1.6 mm
Number of Fetuses: 1
PAPP-A Value: 200.4 ng/mL
Weight: 224 [lb_av]

## 2019-10-05 NOTE — L&D Delivery Note (Signed)
OB/GYN Faculty Practice Delivery Note  Krista Caldwell is a 39 y.o. G3P1011 s/p SVD at [redacted]w[redacted]d. She was admitted for spontaneous onset of labor - contractions and suspected fluid leaking.   ROM: 0h 76m with clear fluid GBS Status: negative Maximum Maternal Temperature: 98.6  Labor Progress: . Arrived on L&D spontaneously pushing with obviously ruptured membranes.  Delivery Date/Time: [phone] Delivery: Called to room and patient arrived on stretcher from [facility] complete and pushing. Head delivered LOA with a compound hand. No nuchal cord present. Head and hand delivered, followed by shoulder/body without complication.  Infant with spontaneous cry, placed on mother's abdomen, dried and stimulated. Cord clamped x 2 after delay, and cut by FOB. Cord blood drawn. Placenta delivered spontaneously, intact, with 3-vessel cord. Fundus firm with massage and Pitocin. Labia, perineum, vagina, and cervix inspected, 2nd degree midline laceration found and repaired with 3.0 vicryl.   Placenta: intact Complications: none Lacerations: 2nd degree EBL: 30ml Analgesia: local lidocaine for repair  Postpartum Planning [x]  transfer orders to MB [x]  discharge summary started & shared [x]  message to sent to schedule follow-up  [x]  lists updated [x]  vaccines UTD  Infant: pending  APGARs 9  9g  , CNM, IBCLC Certified Nurse Midwife, for , [facility] 03/26/2020, 9:44 AM

## 2019-10-19 ENCOUNTER — Encounter: Payer: BC Managed Care – PPO | Admitting: Obstetrics & Gynecology

## 2019-10-19 ENCOUNTER — Ambulatory Visit (INDEPENDENT_AMBULATORY_CARE_PROVIDER_SITE_OTHER): Payer: BC Managed Care – PPO | Admitting: Obstetrics & Gynecology

## 2019-10-19 ENCOUNTER — Encounter: Payer: Self-pay | Admitting: Obstetrics & Gynecology

## 2019-10-19 ENCOUNTER — Ambulatory Visit (INDEPENDENT_AMBULATORY_CARE_PROVIDER_SITE_OTHER): Payer: BC Managed Care – PPO

## 2019-10-19 ENCOUNTER — Other Ambulatory Visit: Payer: Self-pay

## 2019-10-19 VITALS — BP 126/90 | HR 90 | Wt 224.0 lb

## 2019-10-19 DIAGNOSIS — Z3A17 17 weeks gestation of pregnancy: Secondary | ICD-10-CM

## 2019-10-19 DIAGNOSIS — Z31430 Encounter of female for testing for genetic disease carrier status for procreative management: Secondary | ICD-10-CM

## 2019-10-19 DIAGNOSIS — Z1389 Encounter for screening for other disorder: Secondary | ICD-10-CM

## 2019-10-19 DIAGNOSIS — Z363 Encounter for antenatal screening for malformations: Secondary | ICD-10-CM

## 2019-10-19 DIAGNOSIS — Z3481 Encounter for supervision of other normal pregnancy, first trimester: Secondary | ICD-10-CM

## 2019-10-19 DIAGNOSIS — Z331 Pregnant state, incidental: Secondary | ICD-10-CM

## 2019-10-19 DIAGNOSIS — Z3482 Encounter for supervision of other normal pregnancy, second trimester: Secondary | ICD-10-CM

## 2019-10-19 LAB — POCT URINALYSIS DIPSTICK OB
Blood, UA: NEGATIVE
Glucose, UA: NEGATIVE
Ketones, UA: NEGATIVE
Leukocytes, UA: NEGATIVE
Nitrite, UA: NEGATIVE
POC,PROTEIN,UA: NEGATIVE

## 2019-10-19 NOTE — Progress Notes (Signed)
[place] 17+6 wks,breech,posterior placenta gr 0,normal ovaries, svp of fluid 5 cm,cx 4.2 cm,fhr 161 bpm,efw 217 g 50 %,anatomy complete,no obvious abnormalities

## 2019-10-19 NOTE — Addendum Note (Signed)
Addended by: Moss Mc on: 10/19/2019 10:17 AM   Modules accepted: Orders

## 2019-10-19 NOTE — Progress Notes (Signed)
   LOW-RISK PREGNANCY VISIT Patient name: Krista Caldwell MRN [id number]  Date of birth: April 20, 1981 Chief Complaint:   Routine Prenatal Visit (U/S)  History of Present Illness:   Krista Caldwell is a 39 y.o. G70P1011 female at [redacted]w[redacted]d with an Estimated Date of Delivery: 03/22/20 being seen today for ongoing management of a low-risk pregnancy.  Today she reports no complaints. Contractions: Not present.  .  Movement: Absent. denies leaking of fluid. Review of Systems:   Pertinent items are noted in HPI Denies abnormal vaginal discharge w/ itching/odor/irritation, headaches, visual changes, shortness of breath, chest pain, abdominal pain, severe nausea/vomiting, or problems with urination or bowel movements unless otherwise stated above. Pertinent History Reviewed:  Reviewed past medical,surgical, social, obstetrical and family history.  Reviewed problem list, medications and allergies. Physical Assessment:   Vitals:   10/19/19 0947  BP: 126/90  Pulse: 90  Weight: 224 lb (101.6 kg)  Body mass index is 35.61 kg/m.        Physical Examination:   General appearance: Well appearing, and in no distress  Mental status: Alert, oriented to person, place, and time  Skin: Warm & dry  Cardiovascular: Normal heart rate noted  Respiratory: Normal respiratory effort, no distress  Abdomen: Soft, gravid, nontender  Pelvic: Cervical exam deferred         Extremities: Edema: None  Fetal Status: Fetal Heart Rate (bpm): 161 Fundal Height: 20 cm Movement: Absent    Chaperone: n/a    Results for orders placed or performed in visit on 10/19/19 (from the past 24 hour(s))  POC Urinalysis Dipstick OB   Collection Time: 10/19/19  9:50 AM  Result Value Ref Range   Color, UA     Clarity, UA     Glucose, UA Negative Negative   Bilirubin, UA     Ketones, UA neg    Spec Grav, UA     Blood, UA neg    pH, UA     POC,PROTEIN,UA Negative Negative, Trace, Small (1+), Moderate (2+), Large (3+), 4+   Urobilinogen, UA     Nitrite, UA neg    Leukocytes, UA Negative Negative   Appearance     Odor      Assessment & Plan:  1) Low-risk pregnancy G3P1011 at [redacted]w[redacted]d with an Estimated Date of Delivery: 03/22/20   2) Sonogram is normal,    Meds: No orders of the defined types were placed in this encounter.  Labs/procedures today: sonogram  Plan:  Continue routine obstetrical care  Next visit: prefers online    Reviewed: Preterm labor symptoms and general obstetric precautions including but not limited to vaginal bleeding, contractions, leaking of fluid and fetal movement were reviewed in detail with the patient.  All questions were answered.  home bp cuff. Rx faxed to . Check bp weekly, let us know if >140/90.   Follow-up: Return in about 4 weeks (around 11/16/2019) for MyChart Connect visit, LROB.  Orders Placed This Encounter  Procedures  . POC Urinalysis Dipstick OB   Kanon Colunga H Camdynn Maranto  10/19/2019 10:14 AM

## 2019-10-23 LAB — INTEGRATED 2
AFP MoM: 1.41
Alpha-Fetoprotein: 38.2 ng/mL
Crown Rump Length: 60.5 mm
DIA MoM: 1.66
DIA Value: 213 pg/mL
Estriol, Unconjugated: 1.24 ng/mL
Gest. Age on Collection Date: 12.3 weeks
Gestational Age: 17.3 weeks
Maternal Age at EDD: 38.1 yr
Nuchal Translucency (NT): 1.6 mm
Nuchal Translucency MoM: 1.19
Number of Fetuses: 1
PAPP-A MoM: 0.38
PAPP-A Value: 200.4 ng/mL
Test Results:: NEGATIVE
Weight: 224 [lb_av]
Weight: 224 [lb_av]
hCG MoM: 2.19
hCG Value: 45.8 IU/mL
uE3 MoM: 1.13

## 2019-10-24 ENCOUNTER — Other Ambulatory Visit: Payer: Self-pay | Admitting: Advanced Practice Midwife

## 2019-10-24 MED ORDER — ALBUTEROL SULFATE HFA 108 (90 BASE) MCG/ACT IN AERS: 2.0000 | INHALATION_SPRAY | Freq: Four times a day (QID) | RESPIRATORY_TRACT | 3 refills | Status: AC | PRN

## 2019-11-15 ENCOUNTER — Other Ambulatory Visit: Payer: Self-pay

## 2019-11-15 ENCOUNTER — Telehealth (INDEPENDENT_AMBULATORY_CARE_PROVIDER_SITE_OTHER): Payer: BC Managed Care – PPO | Admitting: Advanced Practice Midwife

## 2019-11-15 DIAGNOSIS — Z3482 Encounter for supervision of other normal pregnancy, second trimester: Secondary | ICD-10-CM

## 2019-11-15 DIAGNOSIS — Z3A21 21 weeks gestation of pregnancy: Secondary | ICD-10-CM

## 2019-11-15 NOTE — Progress Notes (Signed)
   TELEHEALTH VIRTUAL OBSTETRICS VISIT ENCOUNTER NOTE  I connected with Krista Caldwell on 11/15/19 at  3:50 PM EST by telephone at home and verified that I am speaking with the correct person using two identifiers.   I discussed the limitations, risks, security and privacy concerns of performing an evaluation and management service by telephone and the availability of in person appointments. I also discussed with the patient that there may be a patient responsible charge related to this service. The patient expressed understanding and agreed to proceed.  Subjective:  Krista Caldwell is a 39 y.o. G3P1011 at [redacted]w[redacted]d being followed for ongoing prenatal care.  She is currently monitored for the following issues for this low-risk pregnancy and has History of miscarriage; Supervision of normal pregnancy; and Hypothyroidism on their problem list.  Patient reports no complaints. Reports fetal movement. Denies any contractions, bleeding or leaking of fluid.   The following portions of the patient's history were reviewed and updated as appropriate: allergies, current medications, past family history, past medical history, past social history, past surgical history and problem list.   Objective:   General:  Alert, oriented and cooperative.   Mental Status: Normal mood and affect perceived. Normal judgment and thought content.  Rest of physical exam deferred due to type of encounter  Assessment and Plan:  Pregnancy: G3P1011 at [redacted]w[redacted]d There are no diagnoses linked to this encounter. Preterm labor symptoms and general obstetric precautions including but not limited to vaginal bleeding, contractions, leaking of fluid and fetal movement were reviewed in detail with the patient.  I discussed the assessment and treatment plan with the patient. The patient was provided an opportunity to ask questions and all were answered. The patient agreed with the plan and demonstrated an understanding of the  instructions. The patient was advised to call back or seek an in-person office evaluation/go to [facility]U at [facility] for any urgent or concerning symptoms. Please refer to After Visit Summary for other counseling recommendations.   I provided 10 minutes of non-face-to-face time during this encounter.  Return in about 5 weeks (around 12/19/2019) for PN2/LROB.  No future appointments.  Jacklyn Shell, CNM [facility] [employer], [facility]

## 2019-11-15 NOTE — Patient Instructions (Addendum)

## 2019-12-19 ENCOUNTER — Other Ambulatory Visit: Payer: Self-pay

## 2019-12-19 ENCOUNTER — Encounter: Payer: Self-pay | Admitting: Women's Health

## 2019-12-19 ENCOUNTER — Other Ambulatory Visit: Payer: BC Managed Care – PPO

## 2019-12-19 ENCOUNTER — Ambulatory Visit (INDEPENDENT_AMBULATORY_CARE_PROVIDER_SITE_OTHER): Payer: BC Managed Care – PPO | Admitting: Women's Health

## 2019-12-19 VITALS — BP 136/86 | HR 97 | Wt 229.6 lb

## 2019-12-19 DIAGNOSIS — Z23 Encounter for immunization: Secondary | ICD-10-CM | POA: Diagnosis not present

## 2019-12-19 DIAGNOSIS — Z331 Pregnant state, incidental: Secondary | ICD-10-CM

## 2019-12-19 DIAGNOSIS — Z131 Encounter for screening for diabetes mellitus: Secondary | ICD-10-CM

## 2019-12-19 DIAGNOSIS — Z3A26 26 weeks gestation of pregnancy: Secondary | ICD-10-CM

## 2019-12-19 DIAGNOSIS — E039 Hypothyroidism, unspecified: Secondary | ICD-10-CM

## 2019-12-19 DIAGNOSIS — Z1389 Encounter for screening for other disorder: Secondary | ICD-10-CM

## 2019-12-19 DIAGNOSIS — Z3482 Encounter for supervision of other normal pregnancy, second trimester: Secondary | ICD-10-CM

## 2019-12-19 DIAGNOSIS — O99282 Endocrine, nutritional and metabolic diseases complicating pregnancy, second trimester: Secondary | ICD-10-CM

## 2019-12-19 LAB — POCT URINALYSIS DIPSTICK OB
Blood, UA: NEGATIVE
Glucose, UA: NEGATIVE
Ketones, UA: NEGATIVE
Leukocytes, UA: NEGATIVE
Nitrite, UA: NEGATIVE
POC,PROTEIN,UA: NEGATIVE

## 2019-12-19 NOTE — Progress Notes (Signed)
   LOW-RISK PREGNANCY VISIT Patient name: Krista Caldwell MRN [id number]  Date of birth: 18-Sep-1981 Chief Complaint:   Routine Prenatal Visit  History of Present Illness:   Krista Caldwell is a 39 y.o. G82P1011 female at [redacted]w[redacted]d with an Estimated Date of Delivery: 03/22/20 being seen today for ongoing management of a low-risk pregnancy.  Today she reports no complaints. Contractions: Not present. Vag. Bleeding: None.  Movement: Present. denies leaking of fluid. Review of Systems:   Pertinent items are noted in HPI Denies abnormal vaginal discharge w/ itching/odor/irritation, headaches, visual changes, shortness of breath, chest pain, abdominal pain, severe nausea/vomiting, or problems with urination or bowel movements unless otherwise stated above. Pertinent History Reviewed:  Reviewed past medical,surgical, social, obstetrical and family history.  Reviewed problem list, medications and allergies. Physical Assessment:   Vitals:   12/19/19 0908  BP: 136/86  Pulse: 97  Weight: 229 lb 9.6 oz (104.1 kg)  Body mass index is 36.5 kg/m.        Physical Examination:   General appearance: Well appearing, and in no distress  Mental status: Alert, oriented to person, place, and time  Skin: Warm & dry  Cardiovascular: Normal heart rate noted  Respiratory: Normal respiratory effort, no distress  Abdomen: Soft, gravid, nontender  Pelvic: Cervical exam deferred         Extremities: Edema: None  Fetal Status: Fetal Heart Rate (bpm): 140 Fundal Height: 26 cm Movement: Present    Chaperone: n/a    Results for orders placed or performed in visit on 12/19/19 (from the past 24 hour(s))  POC Urinalysis Dipstick OB   Collection Time: 12/19/19  9:07 AM  Result Value Ref Range   Color, UA     Clarity, UA     Glucose, UA Negative Negative   Bilirubin, UA     Ketones, UA n    Spec Grav, UA     Blood, UA n    pH, UA     POC,PROTEIN,UA Negative Negative, Trace, Small (1+), Moderate (2+), Large  (3+), 4+   Urobilinogen, UA     Nitrite, UA n    Leukocytes, UA Negative Negative   Appearance     Odor      Assessment & Plan:  1) Low-risk pregnancy G3P1011 at [redacted]w[redacted]d with an Estimated Date of Delivery: 03/22/20   2) Hypothyroidism, on synthroid , recheck TSH today   Meds: No orders of the defined types were placed in this encounter.  Labs/procedures today: pn2, tsh, tdap, declined flu  Plan:  Continue routine obstetrical care  Next visit: prefers online    Reviewed: Preterm labor symptoms and general obstetric precautions including but not limited to vaginal bleeding, contractions, leaking of fluid and fetal movement were reviewed in detail with the patient.  All questions were answered. Has home bp cuff. Check bp weekly, let us know if >140/90.   Follow-up: Return in about 4 weeks (around 01/16/2020) for LROB, CNM, MyChart Video.  Orders Placed This Encounter  Procedures  . Tdap vaccine greater than or equal to 7yo IM  . TSH  . POC Urinalysis Dipstick OB   Cheral Marker CNM, [facility] 12/19/2019 9:41 AM

## 2019-12-19 NOTE — Patient Instructions (Signed)
Krista Caldwell, I greatly value your feedback.  If you receive a survey following your visit with [place] today, we appreciate you taking the time to fill it out.  Thanks, Krista Caldwell, CNM, [facility]  [facility] HOSPITAL HAS MOVED!!! It is now [facility] ([address] [place], [place] [address]) Entrance located off of [address] Free 24/7 valet parking    Go to [email or web address] to register for FREE online childbirth classes   Call the office [phone]) or go to [facility] if:  You begin to have strong, frequent contractions  Your water breaks.  Sometimes it is a big gush of fluid, sometimes it is just a trickle that keeps getting your panties wet or running down your legs  You have vaginal bleeding.  It is normal to have a small amount of spotting if your cervix was checked.   You don't feel your baby moving like normal.  If you don't, get you something to eat and drink and lay down and focus on feeling your baby move.  You should feel at least 10 movements in 2 hours.  If you don't, you should call the office or go to [facility].    Tdap Vaccine  It is recommended that you get the Tdap vaccine during the third trimester of EACH pregnancy to help protect your baby from getting pertussis (whooping cough)  27-36 weeks is the BEST time to do this so that you can pass the protection on to your baby. During pregnancy is better than after pregnancy, but if you are unable to get it during pregnancy it will be offered at the hospital.   You can get this vaccine with [place], at the health department, your family doctor, or some local pharmacies  Everyone who will be around your baby should also be up-to-date on their vaccines before the baby comes. Adults (who are not pregnant) only need 1 dose of Tdap during adulthood.   [place] Pediatricians/Family Doctors:  [facility] [phone]            [facility] [phone]                  [facility] [phone] (usually not accepting new patients unless you have family there already, you are always welcome to call and ask)       [facility] Department [phone]       [facility] Pediatricians/Family Doctors:   [facility]: [phone]  [facility]: [phone]  [facility]: [phone]  [facility] Doctors:   [facility]: [phone]   [facility]: [phone]  [facility] Doctors:  [facility]: [phone]   Home Blood Pressure Monitoring for Patients   Your provider has recommended that you check your blood pressure (BP) at least once a week at home. If you do not have a blood pressure cuff at home, one will be provided for you. Contact your provider if you have not received your monitor within 1 week.   Helpful Tips for Accurate Home Blood Pressure Checks  . Don't smoke, exercise, or drink caffeine 30 minutes before checking your BP . Use the restroom before checking your BP (a full bladder can raise your pressure) . Relax in a comfortable upright chair . Feet on the ground . Left arm resting comfortably on a flat surface at the level of your heart . Legs uncrossed . Back supported . Sit quietly and don't  talk . Place the cuff on your bare arm . Adjust snuggly, so that only two fingertips can fit between your skin and the top of the cuff . Check 2 readings separated by at least one minute . Keep a log of your BP readings . For a visual, please reference this diagram: http://ccnc.care/bpdiagram  Provider Name: [facility]     Phone: [phone]  Zone 1: ALL CLEAR  Continue to monitor your symptoms:  . BP reading is less than 140 (top number) or less than 90 (bottom number)  . No right upper stomach pain . No headaches or seeing spots . No feeling nauseated or throwing up . No swelling in face and  hands  Zone 2: CAUTION Call your doctor's office for any of the following:  . BP reading is greater than 140 (top number) or greater than 90 (bottom number)  . Stomach pain under your ribs in the middle or right side . Headaches or seeing spots . Feeling nauseated or throwing up . Swelling in face and hands  Zone 3: EMERGENCY  Seek immediate medical care if you have any of the following:  . BP reading is greater than160 (top number) or greater than 110 (bottom number) . Severe headaches not improving with Tylenol . Serious difficulty catching your breath . Any worsening symptoms from Zone 2   Third Trimester of Pregnancy The third trimester is from week 29 through week 42, months 7 through 9. The third trimester is a time when the fetus is growing rapidly. At the end of the ninth month, the fetus is about 20 inches in length and weighs 6-10 pounds.  BODY CHANGES Your body goes through many changes during pregnancy. The changes vary from woman to woman.   Your weight will continue to increase. You can expect to gain 25-35 pounds (11-16 kg) by the end of the pregnancy.  You may begin to get stretch marks on your hips, abdomen, and breasts.  You may urinate more often because the fetus is moving lower into your pelvis and pressing on your bladder.  You may develop or continue to have heartburn as a result of your pregnancy.  You may develop constipation because certain hormones are causing the muscles that push waste through your intestines to slow down.  You may develop hemorrhoids or swollen, bulging veins (varicose veins).  You may have pelvic pain because of the weight gain and pregnancy hormones relaxing your joints between the bones in your pelvis. Backaches may result from overexertion of the muscles supporting your posture.  You may have changes in your hair. These can include thickening of your hair, rapid growth, and changes in texture. Some women also have hair loss during  or after pregnancy, or hair that feels dry or thin. Your hair will most likely return to normal after your baby is born.  Your breasts will continue to grow and be tender. A yellow discharge may leak from your breasts called colostrum.  Your belly button may stick out.  You may feel short of breath because of your expanding uterus.  You may notice the fetus "dropping," or moving lower in your abdomen.  You may have a bloody mucus discharge. This usually occurs a few days to a week before labor begins.  Your cervix becomes thin and soft (effaced) near your due date. WHAT TO EXPECT AT YOUR PRENATAL EXAMS  You will have prenatal exams every 2 weeks until week 36. Then, you will have weekly prenatal  exams. During a routine prenatal visit:  You will be weighed to make sure you and the fetus are growing normally.  Your blood pressure is taken.  Your abdomen will be measured to track your baby's growth.  The fetal heartbeat will be listened to.  Any test results from the previous visit will be discussed.  You may have a cervical check near your due date to see if you have effaced. At around 36 weeks, your caregiver will check your cervix. At the same time, your caregiver will also perform a test on the secretions of the vaginal tissue. This test is to determine if a type of bacteria, Group B streptococcus, is present. Your caregiver will explain this further. Your caregiver may ask you:  What your birth plan is.  How you are feeling.  If you are feeling the baby move.  If you have had any abnormal symptoms, such as leaking fluid, bleeding, severe headaches, or abdominal cramping.  If you have any questions. Other tests or screenings that may be performed during your third trimester include:  Blood tests that check for low iron levels (anemia).  Fetal testing to check the health, activity level, and growth of the fetus. Testing is done if you have certain medical conditions or if  there are problems during the pregnancy. FALSE LABOR You may feel small, irregular contractions that eventually go away. These are called Braxton Hicks contractions, or false labor. Contractions may last for hours, days, or even weeks before true labor sets in. If contractions come at regular intervals, intensify, or become painful, it is best to be seen by your caregiver.  SIGNS OF LABOR   Menstrual-like cramps.  Contractions that are 5 minutes apart or less.  Contractions that start on the top of the uterus and spread down to the lower abdomen and back.  A sense of increased pelvic pressure or back pain.  A watery or bloody mucus discharge that comes from the vagina. If you have any of these signs before the 37th week of pregnancy, call your caregiver right away. You need to go to the hospital to get checked immediately. HOME CARE INSTRUCTIONS   Avoid all smoking, herbs, alcohol, and unprescribed drugs. These chemicals affect the formation and growth of the baby.  Follow your caregiver's instructions regarding medicine use. There are medicines that are either safe or unsafe to take during pregnancy.  Exercise only as directed by your caregiver. Experiencing uterine cramps is a good sign to stop exercising.  Continue to eat regular, healthy meals.  Wear a good support bra for breast tenderness.  Do not use hot tubs, steam rooms, or saunas.  Wear your seat belt at all times when driving.  Avoid raw meat, uncooked cheese, cat litter boxes, and soil used by cats. These carry germs that can cause birth defects in the baby.  Take your prenatal vitamins.  Try taking a stool softener (if your caregiver approves) if you develop constipation. Eat more high-fiber foods, such as fresh vegetables or fruit and whole grains. Drink plenty of fluids to keep your urine clear or pale yellow.  Take warm sitz baths to soothe any pain or discomfort caused by hemorrhoids. Use hemorrhoid cream if your  caregiver approves.  If you develop varicose veins, wear support hose. Elevate your feet for 15 minutes, 3-4 times a day. Limit salt in your diet.  Avoid heavy lifting, wear low heal shoes, and practice good posture.  Rest a lot with your legs elevated  if you have leg cramps or low back pain.  Visit your dentist if you have not gone during your pregnancy. Use a soft toothbrush to brush your teeth and be gentle when you floss.  A sexual relationship may be continued unless your caregiver directs you otherwise.  Do not travel far distances unless it is absolutely necessary and only with the approval of your caregiver.  Take prenatal classes to understand, practice, and ask questions about the labor and delivery.  Make a trial run to the hospital.  Pack your hospital bag.  Prepare the baby's nursery.  Continue to go to all your prenatal visits as directed by your caregiver. SEEK MEDICAL CARE IF:  You are unsure if you are in labor or if your water has broken.  You have dizziness.  You have mild pelvic cramps, pelvic pressure, or nagging pain in your abdominal area.  You have persistent nausea, vomiting, or diarrhea.  You have a bad smelling vaginal discharge.  You have pain with urination. SEEK IMMEDIATE MEDICAL CARE IF:   You have a fever.  You are leaking fluid from your vagina.  You have spotting or bleeding from your vagina.  You have severe abdominal cramping or pain.  You have rapid weight loss or gain.  You have shortness of breath with chest pain.  You notice sudden or extreme swelling of your face, hands, ankles, feet, or legs.  You have not felt your baby move in over an hour.  You have severe headaches that do not go away with medicine.  You have vision changes. Document Released: 09/14/2001 Document Revised: 09/25/2013 Document Reviewed: 11/21/2012 [facility] Patient Information 2015 [place], [place]. This information is not intended to replace advice  given to you by your health care provider. Make sure you discuss any questions you have with your health care provider.  PROTECT YOURSELF & YOUR BABY FROM THE FLU! Because you are pregnant, we at [facility], along with the Centers for Disease Control (CDC), recommend that you receive the flu vaccine to protect yourself and your baby from the flu. The flu is more likely to cause severe illness in pregnant women than in women of reproductive age who are not pregnant. Changes in the immune system, heart, and lungs during pregnancy make pregnant women (and women up to two weeks postpartum) more prone to severe illness from flu, including illness resulting in hospitalization. Flu also may be harmful for a pregnant woman's developing baby. A common flu symptom is fever, which may be associated with neural tube defects and other adverse outcomes for a developing baby. Getting vaccinated can also help protect a baby after birth from flu. (Mom passes antibodies onto the developing baby during her pregnancy.)  A Flu Vaccine is the Best Protection Against Flu Getting a flu vaccine is the first and most important step in protecting against flu. Pregnant women should get a flu shot and not the live attenuated influenza vaccine (LAIV), also known as nasal spray flu vaccine. Flu vaccines given during pregnancy help protect both the mother and her baby from flu. Vaccination has been shown to reduce the risk of flu-associated acute respiratory infection in pregnant women by up to one-half. A 2018 study showed that getting a flu shot reduced a pregnant woman's risk of being hospitalized with flu by an average of 40 percent. Pregnant women who get a flu vaccine are also helping to protect their babies from flu illness for the first several months after their birth, when they  are too young to get vaccinated.   A Long Record of Safety for Flu Shots in Pregnant Women Flu shots have been given to millions of pregnant women over  many years with a good safety record. There is a lot of evidence that flu vaccines can be given safely during pregnancy; though these data are limited for the first trimester. The CDC recommends that pregnant women get vaccinated during any trimester of their pregnancy. It is very important for pregnant women to get the flu shot.   Other Preventive Actions In addition to getting a flu shot, pregnant women should take the same everyday preventive actions the CDC recommends of everyone, including covering coughs, washing hands often, and avoiding people who are sick.  Symptoms and Treatment If you get sick with flu symptoms call your doctor right away. There are antiviral drugs that can treat flu illness and prevent serious flu complications. The CDC recommends prompt treatment for people who have influenza infection or suspected influenza infection and who are at high risk of serious flu complications, such as people with asthma, diabetes (including gestational diabetes), or heart disease. Early treatment of influenza in hospitalized pregnant women has been shown to reduce the length of the hospital stay.  Symptoms Flu symptoms include fever, cough, sore throat, runny or stuffy nose, body aches, headache, chills and fatigue. Some people may also have vomiting and diarrhea. People may be infected with the flu and have respiratory symptoms without a fever.  Early Treatment is Important for Pregnant Women Treatment should begin as soon as possible because antiviral drugs work best when started early (within 48 hours after symptoms start). Antiviral drugs can make your flu illness milder and make you feel better faster. They may also prevent serious health problems that can result from flu illness. Oral oseltamivir (Tamiflu) is the preferred treatment for pregnant women because it has the most studies available to suggest that it is safe and beneficial. Antiviral drugs require a prescription from your  provider. Having a fever caused by flu infection or other infections early in pregnancy may be linked to birth defects in a baby. In addition to taking antiviral drugs, pregnant women who get a fever should treat their fever with Tylenol (acetaminophen) and contact their provider immediately.  When to [place] If you are pregnant and have any of these signs, seek care immediately:  Difficulty breathing or shortness of breath  Pain or pressure in the chest or abdomen  Sudden dizziness  Confusion  Severe or persistent vomiting  High fever that is not responding to Tylenol (or store brand equivalent)  Decreased or no movement of your baby  [email or web address]

## 2019-12-20 LAB — RPR: RPR Ser Ql: NONREACTIVE

## 2019-12-20 LAB — CBC
Hematocrit: 35.4 % (ref 34.0–46.6)
Hemoglobin: 12 g/dL (ref 11.1–15.9)
MCH: 30.5 pg (ref 26.6–33.0)
MCHC: 33.9 g/dL (ref 31.5–35.7)
MCV: 90 fL (ref 79–97)
Platelets: 180 10*3/uL (ref 150–450)
RBC: 3.94 x10E6/uL (ref 3.77–5.28)
RDW: 12.5 % (ref 11.7–15.4)
WBC: 8.2 10*3/uL (ref 3.4–10.8)

## 2019-12-20 LAB — ANTIBODY SCREEN: Antibody Screen: NEGATIVE

## 2019-12-20 LAB — GLUCOSE TOLERANCE, 2 HOURS W/ 1HR
Glucose, 1 hour: 139 mg/dL (ref 65–179)
Glucose, 2 hour: 134 mg/dL (ref 65–152)
Glucose, Fasting: 77 mg/dL (ref 65–91)

## 2019-12-20 LAB — HIV ANTIBODY (ROUTINE TESTING W REFLEX): HIV Screen 4th Generation wRfx: NONREACTIVE

## 2019-12-20 LAB — TSH: TSH: 1.7 u[IU]/mL (ref 0.450–4.500)

## 2020-01-16 ENCOUNTER — Telehealth (INDEPENDENT_AMBULATORY_CARE_PROVIDER_SITE_OTHER): Payer: BC Managed Care – PPO | Admitting: Women's Health

## 2020-01-16 ENCOUNTER — Other Ambulatory Visit: Payer: Self-pay

## 2020-01-16 ENCOUNTER — Encounter: Payer: Self-pay | Admitting: Women's Health

## 2020-01-16 VITALS — BP 117/69 | HR 106 | Wt 229.9 lb

## 2020-01-16 DIAGNOSIS — Z3483 Encounter for supervision of other normal pregnancy, third trimester: Secondary | ICD-10-CM

## 2020-01-16 DIAGNOSIS — Z3A30 30 weeks gestation of pregnancy: Secondary | ICD-10-CM

## 2020-01-16 DIAGNOSIS — E039 Hypothyroidism, unspecified: Secondary | ICD-10-CM

## 2020-01-16 DIAGNOSIS — O99283 Endocrine, nutritional and metabolic diseases complicating pregnancy, third trimester: Secondary | ICD-10-CM

## 2020-01-16 NOTE — Patient Instructions (Signed)
Krista Caldwell, I greatly value your feedback.  If you receive a survey following your visit with us today, we appreciate you taking the time to fill it out.  Thanks, Krista Caldwell, CNM, WHNP-BC  [facility] ([address] [place], [place] [address]) Entrance C, located off of [address] Free 24/7 valet parking   Go to [email or web address] to register for FREE online childbirth classes    Call the office ([phone]) or go to [facility] if:  You begin to have strong, frequent contractions  Your water breaks.  Sometimes it is a big gush of fluid, sometimes it is just a trickle that keeps getting your panties wet or running down your legs  You have vaginal bleeding.  It is normal to have a small amount of spotting if your cervix was checked.   You don't feel your baby moving like normal.  If you don't, get you something to eat and drink and lay down and focus on feeling your baby move.  You should feel at least 10 movements in 2 hours.  If you don't, you should call the office or go to [facility].   Call the office ([phone]) or go to [facility] for these signs of pre-eclampsia:  Severe headache that does not go away with Tylenol  Visual changes- seeing spots, double, blurred vision  Pain under your right breast or upper abdomen that does not go away with Tums or heartburn medicine  Nausea and/or vomiting  Severe swelling in your hands, feet, and face    Home Blood Pressure Monitoring for Patients   Your provider has recommended that you check your blood pressure (BP) at least once a week at home. If you do not have a blood pressure cuff at home, one will be provided for you. Contact your provider if you have not received your monitor within 1 week.   Helpful Tips for Accurate Home Blood Pressure Checks  . Don't smoke, exercise, or drink caffeine 30 minutes before checking your BP . Use the restroom before checking your BP (a full  bladder can raise your pressure) . Relax in a comfortable upright chair . Feet on the ground . Left arm resting comfortably on a flat surface at the level of your heart . Legs uncrossed . Back supported . Sit quietly and don't talk . Place the cuff on your bare arm . Adjust snuggly, so that only two fingertips can fit between your skin and the top of the cuff . Check 2 readings separated by at least one minute . Keep a log of your BP readings . For a visual, please reference this diagram: http://ccnc.care/bpdiagram  Provider Name: [facility]     Phone: [phone]  Zone 1: ALL CLEAR  Continue to monitor your symptoms:  . BP reading is less than 140 (top number) or less than 90 (bottom number)  . No right upper stomach pain . No headaches or seeing spots . No feeling nauseated or throwing up . No swelling in face and hands  Zone 2: CAUTION Call your doctor's office for any of the following:  . BP reading is greater than 140 (top number) or greater than 90 (bottom number)  . Stomach pain under your ribs in the middle or right side . Headaches or seeing spots . Feeling nauseated or throwing up . Swelling in face and hands  Zone 3: EMERGENCY  Seek immediate medical care if you have any of   the following:  . BP reading is greater than160 (top number) or greater than 110 (bottom number) . Severe headaches not improving with Tylenol . Serious difficulty catching your breath . Any worsening symptoms from Zone 2  Preterm Labor and Birth Information  The normal length of a pregnancy is 39-41 weeks. Preterm labor is when labor starts before 37 completed weeks of pregnancy. What are the risk factors for preterm labor? Preterm labor is more likely to occur in women who:  Have certain infections during pregnancy such as a bladder infection, sexually transmitted infection, or infection inside the uterus (chorioamnionitis).  Have a shorter-than-normal cervix.  Have gone into  preterm labor before.  Have had surgery on their cervix.  Are younger than age 54 or older than age 25.  Are African American.  Are pregnant with twins or multiple babies (multiple gestation).  Take street drugs or smoke while pregnant.  Do not gain enough weight while pregnant.  Became pregnant shortly after having been pregnant. What are the symptoms of preterm labor? Symptoms of preterm labor include:  Cramps similar to those that can happen during a menstrual period. The cramps may happen with diarrhea.  Pain in the abdomen or lower back.  Regular uterine contractions that may feel like tightening of the abdomen.  A feeling of increased pressure in the pelvis.  Increased watery or bloody mucus discharge from the vagina.  Water breaking (ruptured amniotic sac). Why is it important to recognize signs of preterm labor? It is important to recognize signs of preterm labor because babies who are born prematurely may not be fully developed. This can put them at an increased risk for:  Long-term (chronic) heart and lung problems.  Difficulty immediately after birth with regulating body systems, including blood sugar, body temperature, heart rate, and breathing rate.  Bleeding in the brain.  Cerebral palsy.  Learning difficulties.  Death. These risks are highest for babies who are born before 48 weeks of pregnancy. How is preterm labor treated? Treatment depends on the length of your pregnancy, your condition, and the health of your baby. It may involve: 1. Having a stitch (suture) placed in your cervix to prevent your cervix from opening too early (cerclage). 2. Taking or being given medicines, such as: ? Hormone medicines. These may be given early in pregnancy to help support the pregnancy. ? Medicine to stop contractions. ? Medicines to help mature the baby's lungs. These may be prescribed if the risk of delivery is high. ? Medicines to prevent your baby from  developing cerebral palsy. If the labor happens before 34 weeks of pregnancy, you may need to stay in the hospital. What should I do if I think I am in preterm labor? If you think that you are going into preterm labor, call your health care provider right away. How can I prevent preterm labor in future pregnancies? To increase your chance of having a full-term pregnancy:  Do not use any tobacco products, such as cigarettes, chewing tobacco, and e-cigarettes. If you need help quitting, ask your health care provider.  Do not use street drugs or medicines that have not been prescribed to you during your pregnancy.  Talk with your health care provider before taking any herbal supplements, even if you have been taking them regularly.  Make sure you gain a healthy amount of weight during your pregnancy.  Watch for infection. If you think that you might have an infection, get it checked right away.  Make sure to  tell your health care provider if you have gone into preterm labor before. This information is not intended to replace advice given to you by your health care provider. Make sure you discuss any questions you have with your health care provider. Document Revised: 01/12/2019 Document Reviewed: 02/11/2016 Elsevier Patient Education  [place].

## 2020-01-16 NOTE — Progress Notes (Signed)
   TELEHEALTH VIRTUAL OBSTETRICS VISIT ENCOUNTER NOTE Patient name: Krista Caldwell MRN [id number]  Date of birth: 03-09-1981  I connected with patient on 01/16/20 at  4:10 PM EDT by MyChart video  and verified that I am speaking with the correct person using two identifiers. Due to COVID-19 recommendations, pt is not currently in our office.    I discussed the limitations, risks, security and privacy concerns of performing an evaluation and management service by telephone and the availability of in person appointments. I also discussed with the patient that there may be a patient responsible charge related to this service. The patient expressed understanding and agreed to proceed.  Chief Complaint:   Routine Prenatal Visit (+ braxton hicks contractions)  History of Present Illness:   Krista Caldwell is a 39 y.o. G25P1011 female at [redacted]w[redacted]d with an Estimated Date of Delivery: 03/22/20 being evaluated today for ongoing management of a low-risk pregnancy.  Today she reports went to [facility] and they told her cord was near/around baby's neck. Some braxton hicks. Discussed this is very common, most babies do just fine. Contractions: Irregular. Vag. Bleeding: None.  Movement: Present. denies leaking of fluid. Review of Systems:   Pertinent items are noted in HPI Denies abnormal vaginal discharge w/ itching/odor/irritation, headaches, visual changes, shortness of breath, chest pain, abdominal pain, severe nausea/vomiting, or problems with urination or bowel movements unless otherwise stated above. Pertinent History Reviewed:  Reviewed past medical,surgical, social, obstetrical and family history.  Reviewed problem list, medications and allergies. Physical Assessment:   Vitals:   01/16/20 1550  BP: 117/69  Pulse: (!) 106  Weight: 229 lb 14.4 oz (104.3 kg)  Body mass index is 36.55 kg/m.        Physical Examination:   General:  Alert, oriented and cooperative.   Mental Status: Normal mood and  affect perceived. Normal judgment and thought content.  Rest of physical exam deferred due to type of encounter  No results found for this or any previous visit (from the past 24 hour(s)).  Assessment & Plan:  1) Pregnancy G3P1011 at [redacted]w[redacted]d with an Estimated Date of Delivery: 03/22/20   2) Hypothyroidism, on synthroid daily, last TSH 4wks ago 1.7, will check again at next in person visit   Meds: No orders of the defined types were placed in this encounter.   Labs/procedures today: none  Plan:  Continue routine obstetrical care.  Has home bp cuff.  Check bp weekly, let us know if >140/90.  Next visit: prefers online    Reviewed: Preterm labor symptoms and general obstetric precautions including but not limited to vaginal bleeding, contractions, leaking of fluid and fetal movement were reviewed in detail with the patient. The patient was advised to call back or seek an in-person office evaluation/go to MAU at [facility] for any urgent or concerning symptoms. All questions were answered. Please refer to After Visit Summary for other counseling recommendations.    I provided 15 minutes of non-face-to-face time during this encounter.  Follow-up: Return in about 2 weeks (around 01/30/2020) for LROB, CNM, MyChart Video.  No orders of the defined types were placed in this encounter.  Cheral Marker CNM, [facility] 01/16/2020 4:02 PM

## 2020-01-30 ENCOUNTER — Encounter: Payer: Self-pay | Admitting: Women's Health

## 2020-01-30 ENCOUNTER — Other Ambulatory Visit: Payer: Self-pay

## 2020-01-30 ENCOUNTER — Telehealth (INDEPENDENT_AMBULATORY_CARE_PROVIDER_SITE_OTHER): Payer: BC Managed Care – PPO | Admitting: Women's Health

## 2020-01-30 VITALS — BP 135/74 | HR 116

## 2020-01-30 DIAGNOSIS — Z3483 Encounter for supervision of other normal pregnancy, third trimester: Secondary | ICD-10-CM

## 2020-01-30 NOTE — Progress Notes (Addendum)
TELEHEALTH VIRTUAL OBSTETRICS VISIT ENCOUNTER NOTE Patient name: Krista Caldwell MRN [id number]  Date of birth: [birth year]  I connected with patient on 01/30/20 at  3:50 PM EDT by MyChart video  and verified that I am speaking with the correct person using two identifiers. Due to COVID-19 recommendations, pt is not currently in our office, she is at her home. Provider is in the office.   I discussed the limitations, risks, security and privacy concerns of performing an evaluation and management service by telephone and the availability of in person appointments. I also discussed with the patient that there may be a patient responsible charge related to this service. The patient expressed understanding and agreed to proceed.  Chief Complaint:   Routine Prenatal Visit  History of Present Illness:   Krista Caldwell is a 39 y.o. G50P1011 female at [redacted]w[redacted]d with an Estimated Date of Delivery: 03/22/20 being evaluated today for ongoing management of a low-risk pregnancy.  Depression screen [facility] 2/9 09/14/2019 11/23/2017  Decreased Interest 0 0  Down, Depressed, Hopeless 0 0  PHQ - 2 Score 0 0  Altered sleeping - 0  Tired, decreased energy - 0  Change in appetite - 0  Feeling bad or failure about yourself  - 0  Trouble concentrating - 0  Moving slowly or fidgety/restless - 0  Suicidal thoughts - 0  PHQ-9 Score - 0    Today she reports some irregular contractions. Contractions: Irregular. Vag. Bleeding: None.  Movement: Present. denies leaking of fluid. Review of Systems:   Pertinent items are noted in HPI Denies abnormal vaginal discharge w/ itching/odor/irritation, headaches, visual changes, shortness of breath, chest pain, abdominal pain, severe nausea/vomiting, or problems with urination or bowel movements unless otherwise stated above. Pertinent History Reviewed:  Reviewed past medical,surgical, social, obstetrical and family history.  Reviewed problem list, medications and  allergies. Physical Assessment:   Vitals:   01/30/20 1543  BP: 135/74  Pulse: (!) 116  There is no height or weight on file to calculate BMI.        Physical Examination:   General:  Alert, oriented and cooperative.   Mental Status: Normal mood and affect perceived. Normal judgment and thought content.  Rest of physical exam deferred due to type of encounter  No results found for this or any previous visit (from the past 24 hour(s)).  Assessment & Plan:  1) Pregnancy G3P1011 at [redacted]w[redacted]d with an Estimated Date of Delivery: 03/22/20   2) Hypothyroidism, on synthroid , needs repeat TSH at next in person visit   Meds: No orders of the defined types were placed in this encounter.   Labs/procedures today: none  Plan:  Continue routine obstetrical care.  Has home bp cuff.  Check bp weekly, let us know if >140/90.  Next visit: prefers online    Reviewed: Preterm labor symptoms and general obstetric precautions including but not limited to vaginal bleeding, contractions, leaking of fluid and fetal movement were reviewed in detail with the patient. The patient was advised to call back or seek an in-person office evaluation/go to MAU at [facility] for any urgent or concerning symptoms. All questions were answered. Please refer to After Visit Summary for other counseling recommendations.    I provided 15 minutes of non-face-to-face time during this encounter.  Follow-up: Return in about 2 weeks (around 02/13/2020) for LROB, CNM, MyChart Video.  No orders of the defined types were placed in this encounter.  Cheral Marker CNM, [facility] 01/30/2020  4:35 PM

## 2020-01-30 NOTE — Patient Instructions (Signed)
Lisette Abu, I greatly value your feedback.  If you receive a survey following your visit with [place] today, we appreciate you taking the time to fill it out.  Thanks, [name], CNM, WHNP-BC  [facility] ([address] [place], [place] [address]) Entrance C, located off of [address] valet parking   Go to [email or web address] to register for FREE online childbirth classes    Call the office [phone]) or go to [facility] if:  You begin to have strong, frequent contractions  Your water breaks.  Sometimes it is a big gush of fluid, sometimes it is just a trickle that keeps getting your panties wet or running down your legs  You have vaginal bleeding.  It is normal to have a small amount of spotting if your cervix was checked.   You don't feel your baby moving like normal.  If you don't, get you something to eat and drink and lay down and focus on feeling your baby move.  You should feel at least 10 movements in 2 hours.  If you don't, you should call the office or go to [facility].   Call the office [phone]) or go to [facility] hospital for these signs of pre-eclampsia:  Severe headache that does not go away with Tylenol  Visual changes- seeing spots, double, blurred vision  Pain under your right breast or upper abdomen that does not go away with Tums or heartburn medicine  Nausea and/or vomiting  Severe swelling in your hands, feet, and face    Home Blood Pressure Monitoring for Patients   Your provider has recommended that you check your blood pressure (BP) at least once a week at home. If you do not have a blood pressure cuff at home, one will be provided for you. Contact your provider if you have not received your monitor within 1 week.   Helpful Tips for Accurate Home Blood Pressure Checks  . Don't smoke, exercise, or drink caffeine 30 minutes before checking your BP . Use the restroom before checking your BP (a full  bladder can raise your pressure) . Relax in a comfortable upright chair . Feet on the ground . Left arm resting comfortably on a flat surface at the level of your heart . Legs uncrossed . Back supported . Sit quietly and don't talk . Place the cuff on your bare arm . Adjust snuggly, so that only two fingertips can fit between your skin and the top of the cuff . Check 2 readings separated by at least one minute . Keep a log of your BP readings . For a visual, please reference this diagram: http://ccnc.care/bpdiagram  Provider Name: [facility]     Phone: [phone]  Zone 1: ALL CLEAR  Continue to monitor your symptoms:  . BP reading is less than 140 (top number) or less than 90 (bottom number)  . No right upper stomach pain . No headaches or seeing spots . No feeling nauseated or throwing up . No swelling in face and hands  Zone 2: CAUTION Call your doctor's office for any of the following:  . BP reading is greater than 140 (top number) or greater than 90 (bottom number)  . Stomach pain under your ribs in the middle or right side . Headaches or seeing spots . Feeling nauseated or throwing up . Swelling in face and hands  Zone 3: EMERGENCY  Seek immediate medical care if you have any of  the following:  . BP reading is greater than160 (top number) or greater than 110 (bottom number) . Severe headaches not improving with Tylenol . Serious difficulty catching your breath . Any worsening symptoms from Zone 2  Preterm Labor and Birth Information  The normal length of a pregnancy is 39-41 weeks. Preterm labor is when labor starts before 37 completed weeks of pregnancy. What are the risk factors for preterm labor? Preterm labor is more likely to occur in women who:  Have certain infections during pregnancy such as a bladder infection, sexually transmitted infection, or infection inside the uterus (chorioamnionitis).  Have a shorter-than-normal cervix.  Have gone into  preterm labor before.  Have had surgery on their cervix.  Are younger than age 54 or older than age 25.  Are African American.  Are pregnant with twins or multiple babies (multiple gestation).  Take street drugs or smoke while pregnant.  Do not gain enough weight while pregnant.  Became pregnant shortly after having been pregnant. What are the symptoms of preterm labor? Symptoms of preterm labor include:  Cramps similar to those that can happen during a menstrual period. The cramps may happen with diarrhea.  Pain in the abdomen or lower back.  Regular uterine contractions that may feel like tightening of the abdomen.  A feeling of increased pressure in the pelvis.  Increased watery or bloody mucus discharge from the vagina.  Water breaking (ruptured amniotic sac). Why is it important to recognize signs of preterm labor? It is important to recognize signs of preterm labor because babies who are born prematurely may not be fully developed. This can put them at an increased risk for:  Long-term (chronic) heart and lung problems.  Difficulty immediately after birth with regulating body systems, including blood sugar, body temperature, heart rate, and breathing rate.  Bleeding in the brain.  Cerebral palsy.  Learning difficulties.  Death. These risks are highest for babies who are born before 48 weeks of pregnancy. How is preterm labor treated? Treatment depends on the length of your pregnancy, your condition, and the health of your baby. It may involve: 1. Having a stitch (suture) placed in your cervix to prevent your cervix from opening too early (cerclage). 2. Taking or being given medicines, such as: ? Hormone medicines. These may be given early in pregnancy to help support the pregnancy. ? Medicine to stop contractions. ? Medicines to help mature the baby's lungs. These may be prescribed if the risk of delivery is high. ? Medicines to prevent your baby from  developing cerebral palsy. If the labor happens before 34 weeks of pregnancy, you may need to stay in the hospital. What should I do if I think I am in preterm labor? If you think that you are going into preterm labor, call your health care provider right away. How can I prevent preterm labor in future pregnancies? To increase your chance of having a full-term pregnancy:  Do not use any tobacco products, such as cigarettes, chewing tobacco, and e-cigarettes. If you need help quitting, ask your health care provider.  Do not use street drugs or medicines that have not been prescribed to you during your pregnancy.  Talk with your health care provider before taking any herbal supplements, even if you have been taking them regularly.  Make sure you gain a healthy amount of weight during your pregnancy.  Watch for infection. If you think that you might have an infection, get it checked right away.  Make sure to  tell your health care provider if you have gone into preterm labor before. This information is not intended to replace advice given to you by your health care provider. Make sure you discuss any questions you have with your health care provider. Document Revised: 01/12/2019 Document Reviewed: 02/11/2016 Elsevier Patient Education  [place].

## 2020-02-13 ENCOUNTER — Other Ambulatory Visit: Payer: Self-pay

## 2020-02-13 ENCOUNTER — Encounter: Payer: Self-pay | Admitting: Advanced Practice Midwife

## 2020-02-13 ENCOUNTER — Telehealth (INDEPENDENT_AMBULATORY_CARE_PROVIDER_SITE_OTHER): Payer: BC Managed Care – PPO | Admitting: Advanced Practice Midwife

## 2020-02-13 VITALS — BP 125/86 | HR 96

## 2020-02-13 DIAGNOSIS — Z3A34 34 weeks gestation of pregnancy: Secondary | ICD-10-CM

## 2020-02-13 DIAGNOSIS — O09523 Supervision of elderly multigravida, third trimester: Secondary | ICD-10-CM

## 2020-02-13 DIAGNOSIS — Z3483 Encounter for supervision of other normal pregnancy, third trimester: Secondary | ICD-10-CM

## 2020-02-13 DIAGNOSIS — O0900 Supervision of pregnancy with history of infertility, unspecified trimester: Secondary | ICD-10-CM

## 2020-02-13 DIAGNOSIS — O0903 Supervision of pregnancy with history of infertility, third trimester: Secondary | ICD-10-CM

## 2020-02-13 NOTE — Progress Notes (Signed)
TELEHEALTH VIRTUAL OBSTETRICS VISIT ENCOUNTER NOTE Patient name: Krista Caldwell MRN [id number]  Date of birth: 1981-08-10  I connected with patient on 02/13/20 at  3:50 PM EDT by MyChart and verified that I am speaking with the correct person using two identifiers. Due to COVID-19 recommendations, pt is not currently in our office.    I discussed the limitations, risks, security and privacy concerns of performing an evaluation and management service by telephone and the availability of in person appointments. I also discussed with the patient that there may be a patient responsible charge related to this service. The patient expressed understanding and agreed to proceed.  Chief Complaint:   Routine Prenatal Visit  History of Present Illness:   Krista Caldwell is a 39 y.o. G72P1011 female at [redacted]w[redacted]d with an Estimated Date of Delivery: 03/22/20 being evaluated today for ongoing management of a low-risk pregnancy.  Depression screen [facility] 2/9 09/14/2019 11/23/2017  Decreased Interest 0 0  Down, Depressed, Hopeless 0 0  PHQ - 2 Score 0 0  Altered sleeping - 0  Tired, decreased energy - 0  Change in appetite - 0  Feeling bad or failure about yourself  - 0  Trouble concentrating - 0  Moving slowly or fidgety/restless - 0  Suicidal thoughts - 0  PHQ-9 Score - 0    Today she reports increased pelvic pressure in comparison with first preg. Contractions: Irregular. Vag. Bleeding: None.  Movement: Present. denies leaking of fluid. Review of Systems:   Pertinent items are noted in HPI Denies abnormal vaginal discharge w/ itching/odor/irritation, headaches, visual changes, shortness of breath, chest pain, abdominal pain, severe nausea/vomiting, or problems with urination or bowel movements unless otherwise stated above. Pertinent History Reviewed:  Reviewed past medical,surgical, social, obstetrical and family history.  Reviewed problem list, medications and allergies. Physical Assessment:    Vitals:   02/13/20 1545  BP: 125/86  Pulse: 96  There is no height or weight on file to calculate BMI.        Physical Examination:   General:  Alert, oriented and cooperative.   Mental Status: Normal mood and affect perceived. Normal judgment and thought content.  Rest of physical exam deferred due to type of encounter  No results found for this or any previous visit (from the past 24 hour(s)).  Assessment & Plan:  1) Pregnancy G3P1011 at [redacted]w[redacted]d with an Estimated Date of Delivery: 03/22/20   2) Hypothyroidism, needs 3rd trimester TSH at next visit; takes synthroid   Meds: No orders of the defined types were placed in this encounter.   Labs/procedures today: none  Plan:  Continue routine obstetrical care.  Has home bp cuff.  Check bp weekly, let us know if >140/90.  Next visit: prefers will be in person for TSH, GBS, GC/chlam    Reviewed: Preterm labor symptoms and general obstetric precautions including but not limited to vaginal bleeding, contractions, leaking of fluid and fetal movement were reviewed in detail with the patient. The patient was advised to call back or seek an in-person office evaluation/go to MAU at [facility] for any urgent or concerning symptoms. All questions were answered. Please refer to After Visit Summary for other counseling recommendations.    I provided 8 minutes of non-face-to-face time during this encounter.  Follow-up: Return in about 2 weeks (around 02/27/2020) for LROB, in person.  No orders of the defined types were placed in this encounter.  Arabella Merles CNM 02/13/2020 4:09 PM

## 2020-02-27 ENCOUNTER — Other Ambulatory Visit (HOSPITAL_COMMUNITY)
Admission: RE | Admit: 2020-02-27 | Discharge: 2020-02-27 | Disposition: A | Discharge status: Home / Self Care | Payer: BC Managed Care – PPO | Source: Ambulatory Visit | Attending: Advanced Practice Midwife | Admitting: Advanced Practice Midwife

## 2020-02-27 ENCOUNTER — Encounter: Payer: Self-pay | Admitting: Advanced Practice Midwife

## 2020-02-27 ENCOUNTER — Ambulatory Visit (INDEPENDENT_AMBULATORY_CARE_PROVIDER_SITE_OTHER): Payer: BC Managed Care – PPO | Admitting: Advanced Practice Midwife

## 2020-02-27 VITALS — BP 127/82 | HR 106 | Wt 232.0 lb

## 2020-02-27 DIAGNOSIS — Z3483 Encounter for supervision of other normal pregnancy, third trimester: Secondary | ICD-10-CM

## 2020-02-27 DIAGNOSIS — Z3A36 36 weeks gestation of pregnancy: Secondary | ICD-10-CM | POA: Diagnosis present

## 2020-02-27 DIAGNOSIS — Z1389 Encounter for screening for other disorder: Secondary | ICD-10-CM

## 2020-02-27 DIAGNOSIS — E039 Hypothyroidism, unspecified: Secondary | ICD-10-CM

## 2020-02-27 DIAGNOSIS — O99283 Endocrine, nutritional and metabolic diseases complicating pregnancy, third trimester: Secondary | ICD-10-CM

## 2020-02-27 DIAGNOSIS — Z331 Pregnant state, incidental: Secondary | ICD-10-CM

## 2020-02-27 LAB — POCT URINALYSIS DIPSTICK OB
Blood, UA: NEGATIVE
Glucose, UA: NEGATIVE
Leukocytes, UA: NEGATIVE
Nitrite, UA: NEGATIVE
POC,PROTEIN,UA: NEGATIVE

## 2020-02-27 NOTE — Patient Instructions (Signed)
Braxton Hicks Contractions °Contractions of the uterus can occur throughout pregnancy, but they are not always a sign that you are in labor. You may have practice contractions called Braxton Hicks contractions. These false labor contractions are sometimes confused with true labor. °What are Braxton Hicks contractions? °Braxton Hicks contractions are tightening movements that occur in the muscles of the uterus before labor. Unlike true labor contractions, these contractions do not result in opening (dilation) and thinning of the cervix. Toward the end of pregnancy (32-34 weeks), Braxton Hicks contractions can happen more often and may become stronger. These contractions are sometimes difficult to tell apart from true labor because they can be very uncomfortable. You should not feel embarrassed if you go to the hospital with false labor. °Sometimes, the only way to tell if you are in true labor is for your health care provider to look for changes in the cervix. The health care provider will do a physical exam and may monitor your contractions. If you are not in true labor, the exam should show that your cervix is not dilating and your water has not broken. °If there are no other health problems associated with your pregnancy, it is completely safe for you to be sent home with false labor. You may continue to have Braxton Hicks contractions until you go into true labor. °How to tell the difference between true labor and false labor °True labor °· Contractions last 30-70 seconds. °· Contractions become very regular. °· Discomfort is usually felt in the top of the uterus, and it spreads to the lower abdomen and low back. °· Contractions do not go away with walking. °· Contractions usually become more intense and increase in frequency. °· The cervix dilates and gets thinner. °False labor °· Contractions are usually shorter and not as strong as true labor contractions. °· Contractions are usually irregular. °· Contractions  are often felt in the front of the lower abdomen and in the groin. °· Contractions may go away when you walk around or change positions while lying down. °· Contractions get weaker and are shorter-lasting as time goes on. °· The cervix usually does not dilate or become thin. °Follow these instructions at home: ° °· Take over-the-counter and prescription medicines only as told by your health care provider. °· Keep up with your usual exercises and follow other instructions from your health care provider. °· Eat and drink lightly if you think you are going into labor. °· If Braxton Hicks contractions are making you uncomfortable: °? Change your position from lying down or resting to walking, or change from walking to resting. °? Sit and rest in a tub of warm water. °? Drink enough fluid to keep your urine pale yellow. Dehydration may cause these contractions. °? Do slow and deep breathing several times an hour. °· Keep all follow-up prenatal visits as told by your health care provider. This is important. °Contact a health care provider if: °· You have a fever. °· You have continuous pain in your abdomen. °Get help right away if: °· Your contractions become stronger, more regular, and closer together. °· You have fluid leaking or gushing from your vagina. °· You pass blood-tinged mucus (bloody show). °· You have bleeding from your vagina. °· You have low back pain that you never had before. °· You feel your baby’s head pushing down and causing pelvic pressure. °· Your baby is not moving inside you as much as it used to. °Summary °· Contractions that occur before labor are   called Braxton Hicks contractions, false labor, or practice contractions. °· Braxton Hicks contractions are usually shorter, weaker, farther apart, and less regular than true labor contractions. True labor contractions usually become progressively stronger and regular, and they become more frequent. °· Manage discomfort from Braxton Hicks contractions  by changing position, resting in a warm bath, drinking plenty of water, or practicing deep breathing. °This information is not intended to replace advice given to you by your health care provider. Make sure you discuss any questions you have with your health care provider. °Document Revised: 09/02/2017 Document Reviewed: 02/03/2017 °Elsevier Patient Education © 2020 [employer] ° °

## 2020-02-27 NOTE — Progress Notes (Signed)
   LOW-RISK PREGNANCY VISIT Patient name: Krista Caldwell MRN [id number]  Date of birth: 1981-02-22 Chief Complaint:   Routine Prenatal Visit  History of Present Illness:   Krista Caldwell is a 39 y.o. G53P1011 female at [redacted]w[redacted]d with an Estimated Date of Delivery: 03/22/20 being seen today for ongoing management of a low-risk pregnancy.  Today she reports pelvic pressure, difficulty sleeping. Contractions: Irregular. Vag. Bleeding: None.  Movement: Present. denies leaking of fluid. Review of Systems:   Pertinent items are noted in HPI Denies abnormal vaginal discharge w/ itching/odor/irritation, headaches, visual changes, shortness of breath, chest pain, abdominal pain, severe nausea/vomiting, or problems with urination or bowel movements unless otherwise stated above. Pertinent History Reviewed:  Reviewed past medical,surgical, social, obstetrical and family history.  Reviewed problem list, medications and allergies. Physical Assessment:   Vitals:   02/27/20 1351  BP: 127/82  Pulse: (!) 106  Weight: 232 lb (105.2 kg)  Body mass index is 36.88 kg/m.        Physical Examination:   General appearance: Well appearing, and in no distress  Mental status: Alert, oriented to person, place, and time  Skin: Warm & dry  Cardiovascular: Normal heart rate noted  Respiratory: Normal respiratory effort, no distress  Abdomen: Soft, gravid, nontender  Pelvic: Cervical exam performed  Dilation: 1 Effacement (%): Thick Station: -3  Extremities: Edema: None  Fetal Status: Fetal Heart Rate (bpm): 128 Fundal Height: 36 cm Movement: Present Presentation: Vertex  Results for orders placed or performed in visit on 02/27/20 (from the past 24 hour(s))  POC Urinalysis Dipstick OB   Collection Time: 02/27/20  1:56 PM  Result Value Ref Range   Color, UA     Clarity, UA     Glucose, UA Negative Negative   Bilirubin, UA     Ketones, UA small    Spec Grav, UA     Blood, UA neg    pH, UA     POC,PROTEIN,UA Negative Negative, Trace, Small (1+), Moderate (2+), Large (3+), 4+   Urobilinogen, UA     Nitrite, UA neg    Leukocytes, UA Negative Negative   Appearance     Odor      Assessment & Plan:  1) Low-risk pregnancy G3P1011 at [redacted]w[redacted]d with an Estimated Date of Delivery: 03/22/20   2) Hypothyroidism, check TSH today   Meds: No orders of the defined types were placed in this encounter.  Labs/procedures today: GBS/GC/chlam & TSH  Plan:  Continue routine obstetrical care   Reviewed: Term labor symptoms and general obstetric precautions including but not limited to vaginal bleeding, contractions, leaking of fluid and fetal movement were reviewed in detail with the patient.  All questions were answered. Has home bp cuff. Check bp weekly, let us know if >140/90.   Follow-up: Return in about 1 week (around 03/05/2020) for LROB, in person.  Orders Placed This Encounter  Procedures  . Culture, beta strep (group b only)  . TSH  . POC Urinalysis Dipstick OB   Arabella Merles CNM 02/27/2020 2:10 PM

## 2020-02-28 LAB — TSH: TSH: 2.08 u[IU]/mL (ref 0.450–4.500)

## 2020-02-29 LAB — CERVICOVAGINAL ANCILLARY ONLY
Chlamydia: NEGATIVE
Comment: NEGATIVE
Comment: NORMAL
Neisseria Gonorrhea: NEGATIVE

## 2020-03-02 LAB — CULTURE, BETA STREP (GROUP B ONLY): Strep Gp B Culture: NEGATIVE

## 2020-03-06 ENCOUNTER — Ambulatory Visit (INDEPENDENT_AMBULATORY_CARE_PROVIDER_SITE_OTHER): Payer: BC Managed Care – PPO | Admitting: Advanced Practice Midwife

## 2020-03-06 VITALS — BP 119/87 | HR 88 | Wt 237.0 lb

## 2020-03-06 DIAGNOSIS — Z3483 Encounter for supervision of other normal pregnancy, third trimester: Secondary | ICD-10-CM

## 2020-03-06 DIAGNOSIS — Z331 Pregnant state, incidental: Secondary | ICD-10-CM

## 2020-03-06 DIAGNOSIS — Z1389 Encounter for screening for other disorder: Secondary | ICD-10-CM

## 2020-03-06 DIAGNOSIS — Z3A37 37 weeks gestation of pregnancy: Secondary | ICD-10-CM

## 2020-03-06 DIAGNOSIS — O99283 Endocrine, nutritional and metabolic diseases complicating pregnancy, third trimester: Secondary | ICD-10-CM

## 2020-03-06 DIAGNOSIS — E039 Hypothyroidism, unspecified: Secondary | ICD-10-CM

## 2020-03-06 LAB — POCT URINALYSIS DIPSTICK OB
Blood, UA: NEGATIVE
Glucose, UA: NEGATIVE
Ketones, UA: NEGATIVE
Leukocytes, UA: NEGATIVE
Nitrite, UA: NEGATIVE
POC,PROTEIN,UA: NEGATIVE

## 2020-03-06 NOTE — Progress Notes (Signed)
° °  LOW-RISK PREGNANCY VISIT Patient name: Krista Caldwell MRN [id number]  Date of birth: Sep 28, 1981 Chief Complaint:   Routine Prenatal Visit  History of Present Illness:   Krista Caldwell is a 39 y.o. G63P1011 female at [redacted]w[redacted]d with an Estimated Date of Delivery: 03/22/20 being seen today for ongoing management of a low-risk pregnancy.  Today she reports no complaints. Contractions: Irregular. Vag. Bleeding: Bloody Show.  Movement: Present. denies leaking of fluid. Review of Systems:   Pertinent items are noted in HPI Denies abnormal vaginal discharge w/ itching/odor/irritation, headaches, visual changes, shortness of breath, chest pain, abdominal pain, severe nausea/vomiting, or problems with urination or bowel movements unless otherwise stated above. Pertinent History Reviewed:  Reviewed past medical,surgical, social, obstetrical and family history.  Reviewed problem list, medications and allergies. Physical Assessment:   Vitals:   03/06/20 1612 03/06/20 1614  BP: 133/90 119/87  Pulse: 86 88  Weight: 107.5 kg   Body mass index is 37.68 kg/m.        Physical Examination:   General appearance: Well appearing, and in no distress  Mental status: Alert, oriented to person, place, and time  Skin: Warm & dry  Cardiovascular: Normal heart rate noted  Respiratory: Normal respiratory effort, no distress  Abdomen: Soft, gravid, nontender  Pelvic: Cervical exam performed  Dilation: 1.5 Effacement (%): 20    Extremities: Edema: Trace  Fetal Status: Fetal Heart Rate (bpm): 132 Fundal Height: 38 cm Movement: Present Presentation: Vertex  Chaperone: [place]    Results for orders placed or performed in visit on 03/06/20 (from the past 24 hour(s))  POC Urinalysis Dipstick OB   Collection Time: 03/06/20  4:53 PM  Result Value Ref Range   Color, UA     Clarity, UA     Glucose, UA Negative Negative   Bilirubin, UA     Ketones, UA n    Spec Grav, UA     Blood, UA n    pH, UA       POC,PROTEIN,UA Negative Negative, Trace, Small (1+), Moderate (2+), Large (3+), 4+   Urobilinogen, UA     Nitrite, UA n    Leukocytes, UA Negative Negative   Appearance     Odor      Assessment & Plan:  1) Low-risk pregnancy G3P1011 at [redacted]w[redacted]d with an Estimated Date of Delivery: 03/22/20   2) Hypothyroid , TSH normal last week   Meds: No orders of the defined types were placed in this encounter.  Labs/procedures today: none  Plan:  Continue routine obstetrical care  Next visit: prefers will be in person for BP check    Reviewed: Term labor symptoms and general obstetric precautions including but not limited to vaginal bleeding, contractions, leaking of fluid and fetal movement were reviewed in detail with the patient.  All questions were answered. Has home bp cuff.. Check bp weekly, let us know if >140/90.   Follow-up: Return in about 1 week (around 03/13/2020) for LROB.  Orders Placed This Encounter  Procedures   POC Urinalysis Dipstick OB   Jacklyn Shell DNP, CNM 03/06/2020 10:11 PM

## 2020-03-06 NOTE — Patient Instructions (Signed)

## 2020-03-13 ENCOUNTER — Encounter: Payer: Self-pay | Admitting: Advanced Practice Midwife

## 2020-03-13 ENCOUNTER — Ambulatory Visit (INDEPENDENT_AMBULATORY_CARE_PROVIDER_SITE_OTHER): Payer: BC Managed Care – PPO | Admitting: Advanced Practice Midwife

## 2020-03-13 VITALS — BP 129/87 | HR 105 | Wt 234.0 lb

## 2020-03-13 DIAGNOSIS — Z3483 Encounter for supervision of other normal pregnancy, third trimester: Secondary | ICD-10-CM

## 2020-03-13 DIAGNOSIS — E039 Hypothyroidism, unspecified: Secondary | ICD-10-CM

## 2020-03-13 DIAGNOSIS — Z1389 Encounter for screening for other disorder: Secondary | ICD-10-CM

## 2020-03-13 DIAGNOSIS — Z3A38 38 weeks gestation of pregnancy: Secondary | ICD-10-CM

## 2020-03-13 DIAGNOSIS — Z331 Pregnant state, incidental: Secondary | ICD-10-CM

## 2020-03-13 DIAGNOSIS — O99283 Endocrine, nutritional and metabolic diseases complicating pregnancy, third trimester: Secondary | ICD-10-CM

## 2020-03-13 LAB — POCT URINALYSIS DIPSTICK OB
Blood, UA: NEGATIVE
Glucose, UA: NEGATIVE
Ketones, UA: NEGATIVE
Nitrite, UA: NEGATIVE
POC,PROTEIN,UA: NEGATIVE

## 2020-03-13 NOTE — Progress Notes (Addendum)
   LOW-RISK PREGNANCY VISIT Patient name: Krista Caldwell MRN [id number]  Date of birth: 1981-03-07 Chief Complaint:   Routine Prenatal Visit (? leaking fluid; ? having contractions)  History of Present Illness:   Krista Caldwell is a 39 y.o. G106P1011 female at [redacted]w[redacted]d with an Estimated Date of Delivery: 03/22/20 being seen today for ongoing management of a low-risk pregnancy.  Today she reports backache, fatigue and occasional contractions. Contractions: Irregular. Vag. Bleeding: None.  Movement: Present. denies leaking of fluid, but "pees a lot" and isn't sure if it's amniotic fluid coming out instead of uring  Review of Systems:   Pertinent items are noted in HPI Denies abnormal vaginal discharge w/ itching/odor/irritation, headaches, visual changes, shortness of breath, chest pain, abdominal pain, severe nausea/vomiting, or problems with urination or bowel movements unless otherwise stated above. Pertinent History Reviewed:  Reviewed past medical,surgical, social, obstetrical and family history.  Reviewed problem list, medications and allergies. Physical Assessment:   Vitals:   03/13/20 1422  BP: 129/87  Pulse: (!) 105  Weight: 234 lb (106.1 kg)  Body mass index is 37.2 kg/m.        Physical Examination:   General appearance: Well appearing, and in no distress  Mental status: Alert, oriented to person, place, and time  Skin: Warm & dry  Cardiovascular: Normal heart rate noted  Respiratory: Normal respiratory effort, no distress  Abdomen: Soft, gravid, nontender  Pelvic: Cervical exam performed  Dilation: 1.5 Effacement (%): 20 Station: -2 SSE:  Scant amount of discharge, negative pooling, vasalva  Extremities: Edema: None  Fetal Status: Fetal Heart Rate (bpm): 152 Fundal Height: 39 cm Movement: Present Presentation: Vertex  Chaperone:  Cathie Beams CNM     Results for orders placed or performed in visit on 03/13/20 (from the past 24 hour(s))  POC Urinalysis  Dipstick OB   Collection Time: 03/13/20  2:23 PM  Result Value Ref Range   Color, UA     Clarity, UA     Glucose, UA Negative Negative   Bilirubin, UA     Ketones, UA neg    Spec Grav, UA     Blood, UA neg    pH, UA     POC,PROTEIN,UA Negative Negative, Trace, Small (1+), Moderate (2+), Large (3+), 4+   Urobilinogen, UA     Nitrite, UA neg    Leukocytes, UA Trace (A) Negative   Appearance     Odor      Assessment & Plan:  1) Low-risk pregnancy G3P1011 at 106w5d with an Estimated Date of Delivery: 03/22/20   2) Hypothyroidism, TSH WNL at last check.    Meds: No orders of the defined types were placed in this encounter.  Labs/procedures today: None  Plan:  Continue routine obstetrical care  Next visit: prefers will be in person for (possibly) membrane stripping     Reviewed: Term labor symptoms and general obstetric precautions including but not limited to vaginal bleeding, contractions, leaking of fluid and fetal movement were reviewed in detail with the patient.  All questions were answered. Has home bp cuff.  Check bp weekly, let us know if >140/90.   Follow-up: Return for any time next week for LROB/membrane sweeping.  Orders Placed This Encounter  Procedures   POC Urinalysis Dipstick OB   Fuller Mandril S-NP 03/13/2020 2:55 PM

## 2020-03-13 NOTE — Patient Instructions (Signed)

## 2020-03-18 ENCOUNTER — Encounter: Payer: Self-pay | Admitting: Women's Health

## 2020-03-18 ENCOUNTER — Ambulatory Visit (INDEPENDENT_AMBULATORY_CARE_PROVIDER_SITE_OTHER): Payer: BC Managed Care – PPO | Admitting: Women's Health

## 2020-03-18 VITALS — BP 123/85 | HR 85 | Wt 236.0 lb

## 2020-03-18 DIAGNOSIS — Z3A39 39 weeks gestation of pregnancy: Secondary | ICD-10-CM

## 2020-03-18 DIAGNOSIS — O26843 Uterine size-date discrepancy, third trimester: Secondary | ICD-10-CM

## 2020-03-18 DIAGNOSIS — Z3483 Encounter for supervision of other normal pregnancy, third trimester: Secondary | ICD-10-CM

## 2020-03-18 DIAGNOSIS — Z1389 Encounter for screening for other disorder: Secondary | ICD-10-CM

## 2020-03-18 DIAGNOSIS — O368130 Decreased fetal movements, third trimester, not applicable or unspecified: Secondary | ICD-10-CM | POA: Diagnosis not present

## 2020-03-18 DIAGNOSIS — Z331 Pregnant state, incidental: Secondary | ICD-10-CM

## 2020-03-18 LAB — POCT URINALYSIS DIPSTICK OB
Blood, UA: NEGATIVE
Glucose, UA: NEGATIVE
Ketones, UA: NEGATIVE
Nitrite, UA: NEGATIVE
POC,PROTEIN,UA: NEGATIVE

## 2020-03-18 NOTE — Patient Instructions (Signed)
Lisette Abu, I greatly value your feedback.  If you receive a survey following your visit with [place] today, we appreciate you taking the time to fill it out.  Thanks, [name], CNM, WHNP-BC  [facility] ([address] [place], [place] [address]) Entrance C, located off of [address] valet parking   Go to [email or web address] to register for FREE online childbirth classes    Call the office [phone]) or go to [facility] if:  You begin to have strong, frequent contractions  Your water breaks.  Sometimes it is a big gush of fluid, sometimes it is just a trickle that keeps getting your panties wet or running down your legs  You have vaginal bleeding.  It is normal to have a small amount of spotting if your cervix was checked.   You don't feel your baby moving like normal.  If you don't, get you something to eat and drink and lay down and focus on feeling your baby move.  You should feel at least 10 movements in 2 hours.  If you don't, you should call the office or go to [facility].   Call the office [phone]) or go to [facility] hospital for these signs of pre-eclampsia:  Severe headache that does not go away with Tylenol  Visual changes- seeing spots, double, blurred vision  Pain under your right breast or upper abdomen that does not go away with Tums or heartburn medicine  Nausea and/or vomiting  Severe swelling in your hands, feet, and face    Home Blood Pressure Monitoring for Patients   Your provider has recommended that you check your blood pressure (BP) at least once a week at home. If you do not have a blood pressure cuff at home, one will be provided for you. Contact your provider if you have not received your monitor within 1 week.   Helpful Tips for Accurate Home Blood Pressure Checks  . Don't smoke, exercise, or drink caffeine 30 minutes before checking your BP . Use the restroom before checking your BP (a full  bladder can raise your pressure) . Relax in a comfortable upright chair . Feet on the ground . Left arm resting comfortably on a flat surface at the level of your heart . Legs uncrossed . Back supported . Sit quietly and don't talk . Place the cuff on your bare arm . Adjust snuggly, so that only two fingertips can fit between your skin and the top of the cuff . Check 2 readings separated by at least one minute . Keep a log of your BP readings . For a visual, please reference this diagram: http://ccnc.care/bpdiagram  Provider Name: [facility]     Phone: [phone]  Zone 1: ALL CLEAR  Continue to monitor your symptoms:  . BP reading is less than 140 (top number) or less than 90 (bottom number)  . No right upper stomach pain . No headaches or seeing spots . No feeling nauseated or throwing up . No swelling in face and hands  Zone 2: CAUTION Call your doctor's office for any of the following:  . BP reading is greater than 140 (top number) or greater than 90 (bottom number)  . Stomach pain under your ribs in the middle or right side . Headaches or seeing spots . Feeling nauseated or throwing up . Swelling in face and hands  Zone 3: EMERGENCY  Seek immediate medical care if you have any of  the following:  . BP reading is greater than160 (top number) or greater than 110 (bottom number) . Severe headaches not improving with Tylenol . Serious difficulty catching your breath . Any worsening symptoms from Zone 2   Braxton Hicks Contractions Contractions of the uterus can occur throughout pregnancy, but they are not always a sign that you are in labor. You may have practice contractions called Braxton Hicks contractions. These false labor contractions are sometimes confused with true labor. What are Braxton Hicks contractions? Braxton Hicks contractions are tightening movements that occur in the muscles of the uterus before labor. Unlike true labor contractions, these  contractions do not result in opening (dilation) and thinning of the cervix. Toward the end of pregnancy (32-34 weeks), Braxton Hicks contractions can happen more often and may become stronger. These contractions are sometimes difficult to tell apart from true labor because they can be very uncomfortable. You should not feel embarrassed if you go to the hospital with false labor. Sometimes, the only way to tell if you are in true labor is for your health care provider to look for changes in the cervix. The health care provider will do a physical exam and may monitor your contractions. If you are not in true labor, the exam should show that your cervix is not dilating and your water has not broken. If there are no other health problems associated with your pregnancy, it is completely safe for you to be sent home with false labor. You may continue to have Braxton Hicks contractions until you go into true labor. How to tell the difference between true labor and false labor True labor  Contractions last 30-70 seconds.  Contractions become very regular.  Discomfort is usually felt in the top of the uterus, and it spreads to the lower abdomen and low back.  Contractions do not go away with walking.  Contractions usually become more intense and increase in frequency.  The cervix dilates and gets thinner. False labor  Contractions are usually shorter and not as strong as true labor contractions.  Contractions are usually irregular.  Contractions are often felt in the front of the lower abdomen and in the groin.  Contractions may go away when you walk around or change positions while lying down.  Contractions get weaker and are shorter-lasting as time goes on.  The cervix usually does not dilate or become thin. Follow these instructions at home:  1. Take over-the-counter and prescription medicines only as told by your health care provider. 2. Keep up with your usual exercises and follow other  instructions from your health care provider. 3. Eat and drink lightly if you think you are going into labor. 4. If Braxton Hicks contractions are making you uncomfortable: ? Change your position from lying down or resting to walking, or change from walking to resting. ? Sit and rest in a tub of warm water. ? Drink enough fluid to keep your urine pale yellow. Dehydration may cause these contractions. ? Do slow and deep breathing several times an hour. 5. Keep all follow-up prenatal visits as told by your health care provider. This is important. Contact a health care provider if:  You have a fever.  You have continuous pain in your abdomen. Get help right away if:  Your contractions become stronger, more regular, and closer together.  You have fluid leaking or gushing from your vagina.  You pass blood-tinged mucus (bloody show).  You have bleeding from your vagina.  You have low back   pain that you never had before.  You feel your baby's head pushing down and causing pelvic pressure.  Your baby is not moving inside you as much as it used to. Summary  Contractions that occur before labor are called Braxton Hicks contractions, false labor, or practice contractions.  Braxton Hicks contractions are usually shorter, weaker, farther apart, and less regular than true labor contractions. True labor contractions usually become progressively stronger and regular, and they become more frequent.  Manage discomfort from [facility] contractions by changing position, resting in a warm bath, drinking plenty of water, or practicing deep breathing. This information is not intended to replace advice given to you by your health care provider. Make sure you discuss any questions you have with your health care provider. Document Revised: 09/02/2017 Document Reviewed: 02/03/2017 Elsevier Patient Education  [place].

## 2020-03-18 NOTE — Progress Notes (Signed)
LOW-RISK PREGNANCY VISIT Patient name: Krista Caldwell MRN [id number]  Date of birth: Feb 17, 1981 Chief Complaint:   Routine Prenatal Visit  History of Present Illness:   Krista Caldwell is a 39 y.o. G17P1011 female at [redacted]w[redacted]d with an Estimated Date of Delivery: 03/22/20 being seen today for ongoing management of a low-risk pregnancy.  Depression screen [facility] 2/9 09/14/2019 11/23/2017  Decreased Interest 0 0  Down, Depressed, Hopeless 0 0  PHQ - 2 Score 0 0  Altered sleeping - 0  Tired, decreased energy - 0  Change in appetite - 0  Feeling bad or failure about yourself  - 0  Trouble concentrating - 0  Moving slowly or fidgety/restless - 0  Suicidal thoughts - 0  PHQ-9 Score - 0    Today she reports decreased fm this am. Contractions: Not present. Vag. Bleeding: None.  Movement: (!) Decreased. denies leaking of fluid. Review of Systems:   Pertinent items are noted in HPI Denies abnormal vaginal discharge w/ itching/odor/irritation, headaches, visual changes, shortness of breath, chest pain, abdominal pain, severe nausea/vomiting, or problems with urination or bowel movements unless otherwise stated above. Pertinent History Reviewed:  Reviewed past medical,surgical, social, obstetrical and family history.  Reviewed problem list, medications and allergies. Physical Assessment:   Vitals:   03/18/20 0918  BP: 123/85  Pulse: 85  Weight: 236 lb (107 kg)  Body mass index is 37.52 kg/m.        Physical Examination:   General appearance: Well appearing, and in no distress  Mental status: Alert, oriented to person, place, and time  Skin: Warm & dry  Cardiovascular: Normal heart rate noted  Respiratory: Normal respiratory effort, no distress  Abdomen: Soft, gravid, nontender  Pelvic: Cervical exam performed  Dilation: 1.5 Effacement (%): 20 Station: -2  Extremities: Edema: Trace  Fetal Status: Fetal Heart Rate (bpm): 137 Fundal Height: 35 cm Movement: (!) Decreased Presentation:  Vertex NST: FHR baseline 135 bpm, Variability: moderate, Accelerations:present, Decelerations:  Absent= Cat 1/Reactive Toco: none   Chaperone: pt declined    Results for orders placed or performed in visit on 03/18/20 (from the past 24 hour(s))  POC Urinalysis Dipstick OB   Collection Time: 03/18/20  9:19 AM  Result Value Ref Range   Color, UA     Clarity, UA     Glucose, UA Negative Negative   Bilirubin, UA     Ketones, UA neg    Spec Grav, UA     Blood, UA neg    pH, UA     POC,PROTEIN,UA Negative Negative, Trace, Small (1+), Moderate (2+), Large (3+), 4+   Urobilinogen, UA     Nitrite, UA neg    Leukocytes, UA Trace (A) Negative   Appearance     Odor      Assessment & Plan:  1) Low-risk pregnancy G3P1011 at [redacted]w[redacted]d with an Estimated Date of Delivery: 03/22/20   2) Decreased fm, reactive NST w/ increased fm while on efm, discussed kick counts  3) Uterine s<d: will get efw/afi u/s w/ MFM (no appt here)   Meds: No orders of the defined types were placed in this encounter.  Labs/procedures today: sve, nst  Plan:  Continue routine obstetrical care  Next visit: prefers will be in person for nst    Reviewed: Term labor symptoms and general obstetric precautions including but not limited to vaginal bleeding, contractions, leaking of fluid and fetal movement were reviewed in detail with the patient.  All questions were answered. Has  home bp cuff.  Check bp weekly, let us know if >140/90.   Follow-up: Return in about 1 week (around 03/25/2020) for LROB, NST, in person, CNM;, ASAP efw u/s MFM.  Orders Placed This Encounter  Procedures  . [place] MFM OB COMP + 14 WK  . POC Urinalysis Dipstick OB   Roma Schanz CNM, [facility] 03/18/2020 10:12 AM

## 2020-03-19 ENCOUNTER — Ambulatory Visit (INDEPENDENT_AMBULATORY_CARE_PROVIDER_SITE_OTHER): Payer: BC Managed Care – PPO

## 2020-03-19 DIAGNOSIS — O0900 Supervision of pregnancy with history of infertility, unspecified trimester: Secondary | ICD-10-CM

## 2020-03-19 DIAGNOSIS — Z3A39 39 weeks gestation of pregnancy: Secondary | ICD-10-CM

## 2020-03-19 DIAGNOSIS — O0903 Supervision of pregnancy with history of infertility, third trimester: Secondary | ICD-10-CM | POA: Diagnosis not present

## 2020-03-19 DIAGNOSIS — Z3483 Encounter for supervision of other normal pregnancy, third trimester: Secondary | ICD-10-CM | POA: Diagnosis not present

## 2020-03-19 DIAGNOSIS — O26843 Uterine size-date discrepancy, third trimester: Secondary | ICD-10-CM | POA: Diagnosis not present

## 2020-03-19 NOTE — Progress Notes (Signed)
[place] 39+4 wks,cephalic,posterior placenta gr 3,fhr 161 bpm,afi 12.8 cm,EFW 3559 g 52%

## 2020-03-25 ENCOUNTER — Encounter: Payer: Self-pay | Admitting: Obstetrics & Gynecology

## 2020-03-25 ENCOUNTER — Ambulatory Visit (INDEPENDENT_AMBULATORY_CARE_PROVIDER_SITE_OTHER): Payer: BC Managed Care – PPO | Admitting: Obstetrics & Gynecology

## 2020-03-25 VITALS — BP 127/83 | HR 98 | Wt 235.0 lb

## 2020-03-25 DIAGNOSIS — O480 Post-term pregnancy: Secondary | ICD-10-CM

## 2020-03-25 DIAGNOSIS — O0900 Supervision of pregnancy with history of infertility, unspecified trimester: Secondary | ICD-10-CM

## 2020-03-25 DIAGNOSIS — Z3483 Encounter for supervision of other normal pregnancy, third trimester: Secondary | ICD-10-CM

## 2020-03-25 DIAGNOSIS — Z3A40 40 weeks gestation of pregnancy: Secondary | ICD-10-CM | POA: Diagnosis not present

## 2020-03-25 DIAGNOSIS — Z331 Pregnant state, incidental: Secondary | ICD-10-CM

## 2020-03-25 DIAGNOSIS — Z1389 Encounter for screening for other disorder: Secondary | ICD-10-CM

## 2020-03-25 DIAGNOSIS — O0903 Supervision of pregnancy with history of infertility, third trimester: Secondary | ICD-10-CM

## 2020-03-25 LAB — POCT URINALYSIS DIPSTICK OB
Blood, UA: NEGATIVE
Glucose, UA: NEGATIVE
Ketones, UA: NEGATIVE
Nitrite, UA: NEGATIVE
POC,PROTEIN,UA: NEGATIVE

## 2020-03-25 NOTE — Treatment Plan (Signed)
   Induction Assessment Scheduling Form: Fax to [facility]:  [phone]  Krista Caldwell                                                                                   DOB:  1981-05-04                                                            MRN:  [id number]                                                                     Phone #:   In EPIC                         Provider:  [facility]  GP:  [id number]                                                            Estimated Date of Delivery: 03/22/20  Dating Criteria: LMP eary sonogram    Medical Indications for induction:  Post dates Admission Date/Time:  03/28/20 @ 2345 Gestational age on admission:  [redacted]w[redacted]d   Filed Weights   03/25/20 0946  Weight: 235 lb (106.6 kg)   HIV:  Non Reactive (03/17 0842) GBS: Negative/-- (05/26 1600)  1.5/th/-3/mid/soft   Method of induction(proposed):  choice   Scheduling Provider Signature:  Lazaro Arms, MD                                            Today's Date:  03/25/2020

## 2020-03-25 NOTE — Progress Notes (Signed)
LOW-RISK PREGNANCY VISIT Patient name: Krista Caldwell MRN [id number]  Date of birth: 07-26-1981 Chief Complaint:   Routine Prenatal Visit (NST)  History of Present Illness:   Krista Caldwell is a 39 y.o. G76P1011 female at [redacted]w[redacted]d with an Estimated Date of Delivery: 03/22/20 being seen today for ongoing management of a low-risk pregnancy.  Depression screen [facility] 2/9 09/14/2019 11/23/2017  Decreased Interest 0 0  Down, Depressed, Hopeless 0 0  PHQ - 2 Score 0 0  Altered sleeping - 0  Tired, decreased energy - 0  Change in appetite - 0  Feeling bad or failure about yourself  - 0  Trouble concentrating - 0  Moving slowly or fidgety/restless - 0  Suicidal thoughts - 0  PHQ-9 Score - 0    Today she reports no complaints. Contractions: Irregular. Vag. Bleeding: None.  Movement: Present. denies leaking of fluid. Review of Systems:   Pertinent items are noted in HPI Denies abnormal vaginal discharge w/ itching/odor/irritation, headaches, visual changes, shortness of breath, chest pain, abdominal pain, severe nausea/vomiting, or problems with urination or bowel movements unless otherwise stated above. Pertinent History Reviewed:  Reviewed past medical,surgical, social, obstetrical and family history.  Reviewed problem list, medications and allergies. Physical Assessment:   Vitals:   03/25/20 0946  BP: 127/83  Pulse: 98  Weight: 235 lb (106.6 kg)  Body mass index is 37.36 kg/m.        Physical Examination:   General appearance: Well appearing, and in no distress  Mental status: Alert, oriented to person, place, and time  Skin: Warm & dry  Cardiovascular: Normal heart rate noted  Respiratory: Normal respiratory effort, no distress  Abdomen: Soft, gravid, nontender  Pelvic: Cervical exam performed  Dilation: 1.5 Effacement (%): Thick Station: -3  Extremities: Edema: Trace  Fetal Status: Fetal Heart Rate (bpm): 135 Fundal Height: 36 cm Movement: Present Presentation:  Vertex  Chaperone: Levy Pupa    Results for orders placed or performed in visit on 03/25/20 (from the past 24 hour(s))  POC Urinalysis Dipstick OB   Collection Time: 03/25/20  9:47 AM  Result Value Ref Range   Color, UA     Clarity, UA     Glucose, UA Negative Negative   Bilirubin, UA     Ketones, UA neg    Spec Grav, UA     Blood, UA neg    pH, UA     POC,PROTEIN,UA Negative Negative, Trace, Small (1+), Moderate (2+), Large (3+), 4+   Urobilinogen, UA     Nitrite, UA neg    Leukocytes, UA Trace (A) Negative   Appearance     Odor      Assessment & Plan:  1) Low-risk pregnancy G3P1011 at [redacted]w[redacted]d with an Estimated Date of Delivery: 03/22/20   2) >40 weeks pregnancy, IOL 6/25 2345   Meds: No orders of the defined types were placed in this encounter.  Labs/procedures today: Reactive NST  Plan:  IOL 6/25@2345  scheduled Next visit: prefers in person    Reviewed: Term labor symptoms and general obstetric precautions including but not limited to vaginal bleeding, contractions, leaking of fluid and fetal movement were reviewed in detail with the patient.  All questions were answered. Has home bp cuff. Rx faxed to . Check bp weekly, let us know if >140/90.   Follow-up: Return in about 6 weeks (around 05/06/2020) for post partum visit.  Orders Placed This Encounter  Procedures  . POC Urinalysis Dipstick OB   Mertie Clause  Michon Kaczmarek  03/25/2020 10:40 AM

## 2020-03-26 ENCOUNTER — Other Ambulatory Visit: Payer: Self-pay

## 2020-03-26 ENCOUNTER — Inpatient Hospital Stay (HOSPITAL_COMMUNITY)
Admission: AD | Admit: 2020-03-26 | Discharge: 2020-03-27 | DRG: 807 | Disposition: A | Discharge status: Home / Self Care | Payer: BC Managed Care – PPO | Attending: Family Medicine | Admitting: Family Medicine

## 2020-03-26 ENCOUNTER — Encounter (HOSPITAL_COMMUNITY): Payer: Self-pay | Admitting: Obstetrics & Gynecology

## 2020-03-26 DIAGNOSIS — N898 Other specified noninflammatory disorders of vagina: Secondary | ICD-10-CM | POA: Diagnosis not present

## 2020-03-26 DIAGNOSIS — E039 Hypothyroidism, unspecified: Secondary | ICD-10-CM | POA: Diagnosis present

## 2020-03-26 DIAGNOSIS — O479 False labor, unspecified: Secondary | ICD-10-CM

## 2020-03-26 DIAGNOSIS — O9952 Diseases of the respiratory system complicating childbirth: Secondary | ICD-10-CM | POA: Diagnosis present

## 2020-03-26 DIAGNOSIS — Z3A40 40 weeks gestation of pregnancy: Secondary | ICD-10-CM

## 2020-03-26 DIAGNOSIS — O26893 Other specified pregnancy related conditions, third trimester: Secondary | ICD-10-CM

## 2020-03-26 DIAGNOSIS — O99284 Endocrine, nutritional and metabolic diseases complicating childbirth: Secondary | ICD-10-CM | POA: Diagnosis present

## 2020-03-26 DIAGNOSIS — Z3A41 41 weeks gestation of pregnancy: Secondary | ICD-10-CM | POA: Diagnosis present

## 2020-03-26 DIAGNOSIS — O480 Post-term pregnancy: Secondary | ICD-10-CM | POA: Diagnosis present

## 2020-03-26 DIAGNOSIS — Z20822 Contact with and (suspected) exposure to covid-19: Secondary | ICD-10-CM | POA: Diagnosis present

## 2020-03-26 DIAGNOSIS — J45909 Unspecified asthma, uncomplicated: Secondary | ICD-10-CM | POA: Diagnosis present

## 2020-03-26 DIAGNOSIS — Z0371 Encounter for suspected problem with amniotic cavity and membrane ruled out: Secondary | ICD-10-CM

## 2020-03-26 LAB — SARS CORONAVIRUS 2 BY RT PCR (HOSPITAL ORDER, PERFORMED IN ~~LOC~~ HOSPITAL LAB): SARS Coronavirus 2: NEGATIVE

## 2020-03-26 LAB — TYPE AND SCREEN
ABO/RH(D): O POS
Antibody Screen: NEGATIVE

## 2020-03-26 LAB — POCT FERN TEST: POCT Fern Test: NEGATIVE

## 2020-03-26 LAB — ABO/RH: ABO/RH(D): O POS

## 2020-03-26 MED ORDER — BENZOCAINE-MENTHOL 20-0.5 % EX AERO
1.0000 "application " | INHALATION_SPRAY | CUTANEOUS | Status: DC | PRN
  Administered 2020-03-26: 1 via TOPICAL
  Filled 2020-03-26: qty 56

## 2020-03-26 MED ORDER — LEVOTHYROXINE SODIUM 50 MCG PO TABS
25.0000 ug | ORAL_TABLET | Freq: Every day | ORAL | Status: DC
  Administered 2020-03-27: 25 ug via ORAL
  Filled 2020-03-26: qty 1

## 2020-03-26 MED ORDER — SOD CITRATE-CITRIC ACID 500-334 MG/5ML PO SOLN: 30.0000 mL | ORAL | Status: DC | PRN

## 2020-03-26 MED ORDER — ONDANSETRON HCL 4 MG/2ML IJ SOLN: 4.0000 mg | Freq: Four times a day (QID) | INTRAMUSCULAR | Status: DC | PRN

## 2020-03-26 MED ORDER — COCONUT OIL OIL
1.0000 "application " | TOPICAL_OIL | Status: DC | PRN
  Administered 2020-03-27: 1 via TOPICAL

## 2020-03-26 MED ORDER — OXYTOCIN 10 UNIT/ML IJ SOLN
INTRAMUSCULAR | Status: AC
  Filled 2020-03-26: qty 1

## 2020-03-26 MED ORDER — ONDANSETRON HCL 4 MG PO TABS: 4.0000 mg | ORAL_TABLET | ORAL | Status: DC | PRN

## 2020-03-26 MED ORDER — OXYCODONE-ACETAMINOPHEN 5-325 MG PO TABS: 1.0000 | ORAL_TABLET | ORAL | Status: DC | PRN

## 2020-03-26 MED ORDER — DIBUCAINE (PERIANAL) 1 % EX OINT: 1.0000 "application " | TOPICAL_OINTMENT | CUTANEOUS | Status: DC | PRN

## 2020-03-26 MED ORDER — SENNOSIDES-DOCUSATE SODIUM 8.6-50 MG PO TABS
2.0000 | ORAL_TABLET | ORAL | Status: DC
  Administered 2020-03-26: 2 via ORAL
  Filled 2020-03-26: qty 2

## 2020-03-26 MED ORDER — OXYTOCIN-SODIUM CHLORIDE 30-0.9 UT/500ML-% IV SOLN: 2.5000 [IU]/h | INTRAVENOUS | Status: DC

## 2020-03-26 MED ORDER — LACTATED RINGERS IV SOLN: INTRAVENOUS | Status: DC

## 2020-03-26 MED ORDER — LIDOCAINE HCL (PF) 1 % IJ SOLN
30.0000 mL | INTRAMUSCULAR | Status: AC | PRN
  Administered 2020-03-26: 30 mL via SUBCUTANEOUS
  Filled 2020-03-26: qty 30

## 2020-03-26 MED ORDER — OXYCODONE-ACETAMINOPHEN 5-325 MG PO TABS: 2.0000 | ORAL_TABLET | ORAL | Status: DC | PRN

## 2020-03-26 MED ORDER — WITCH HAZEL-GLYCERIN EX PADS
1.0000 "application " | MEDICATED_PAD | CUTANEOUS | Status: DC | PRN
  Administered 2020-03-26: 1 via TOPICAL

## 2020-03-26 MED ORDER — DIPHENHYDRAMINE HCL 25 MG PO CAPS: 25.0000 mg | ORAL_CAPSULE | Freq: Four times a day (QID) | ORAL | Status: DC | PRN

## 2020-03-26 MED ORDER — ONDANSETRON HCL 4 MG/2ML IJ SOLN: 4.0000 mg | INTRAMUSCULAR | Status: DC | PRN

## 2020-03-26 MED ORDER — MISOPROSTOL 25 MCG QUARTER TABLET: 25.0000 ug | ORAL_TABLET | ORAL | Status: DC | PRN

## 2020-03-26 MED ORDER — OXYTOCIN 10 UNIT/ML IJ SOLN
10.0000 [IU] | Freq: Once | INTRAMUSCULAR | Status: AC
  Administered 2020-03-26: 10 [IU] via INTRAMUSCULAR

## 2020-03-26 MED ORDER — ZOLPIDEM TARTRATE 5 MG PO TABS: 5.0000 mg | ORAL_TABLET | Freq: Every evening | ORAL | Status: DC | PRN

## 2020-03-26 MED ORDER — ACETAMINOPHEN 325 MG PO TABS
650.0000 mg | ORAL_TABLET | ORAL | Status: DC | PRN
  Administered 2020-03-26 – 2020-03-27 (×5): 650 mg via ORAL
  Filled 2020-03-26 (×5): qty 2

## 2020-03-26 MED ORDER — ACETAMINOPHEN 325 MG PO TABS: 650.0000 mg | ORAL_TABLET | ORAL | Status: DC | PRN

## 2020-03-26 MED ORDER — FENTANYL CITRATE (PF) 100 MCG/2ML IJ SOLN: 50.0000 ug | INTRAMUSCULAR | Status: DC | PRN

## 2020-03-26 MED ORDER — TERBUTALINE SULFATE 1 MG/ML IJ SOLN: 0.2500 mg | Freq: Once | INTRAMUSCULAR | Status: DC | PRN

## 2020-03-26 MED ORDER — SIMETHICONE 80 MG PO CHEW: 80.0000 mg | CHEWABLE_TABLET | ORAL | Status: DC | PRN

## 2020-03-26 MED ORDER — ALBUTEROL SULFATE (2.5 MG/3ML) 0.083% IN NEBU: 3.0000 mL | INHALATION_SOLUTION | Freq: Four times a day (QID) | RESPIRATORY_TRACT | Status: DC | PRN

## 2020-03-26 MED ORDER — LACTATED RINGERS IV SOLN: 500.0000 mL | INTRAVENOUS | Status: DC | PRN

## 2020-03-26 MED ORDER — OXYTOCIN BOLUS FROM INFUSION: 333.0000 mL | Freq: Once | INTRAVENOUS | Status: DC

## 2020-03-26 MED ORDER — PRENATAL MULTIVITAMIN CH
1.0000 | ORAL_TABLET | Freq: Every day | ORAL | Status: DC
  Administered 2020-03-26 – 2020-03-27 (×2): 1 via ORAL
  Filled 2020-03-26 (×2): qty 1

## 2020-03-26 MED ORDER — OXYTOCIN-SODIUM CHLORIDE 30-0.9 UT/500ML-% IV SOLN: 1.0000 m[IU]/min | INTRAVENOUS | Status: DC

## 2020-03-26 NOTE — Discharge Summary (Signed)
Postpartum Discharge Summary    Patient Name: Krista Caldwell DOB: [birth year] MRN: [id number]  Date of admission: 03/26/2020 Delivery date:03/26/2020  Delivering provider: Gaylan Gerold R  Date of discharge: 03/27/2020  Admitting diagnosis: Post term pregnancy at [redacted] weeks gestation [O48.0, Z3A.41] Indication for care in labor or delivery [O75.9] Intrauterine pregnancy: [redacted]w[redacted]d    Secondary diagnosis:  Active Problems:   Post term pregnancy at [redacted]weeks gestation   Vaginal delivery   Indication for care in labor or delivery  Additional problems: Hypothyroid    Discharge diagnosis: Term Pregnancy Delivered                                              Post partum procedures:None Augmentation: N/A Complications: None  Hospital course: Onset of Labor With Vaginal Delivery      39y.o. yo G3P1011 at 427w4das admitted in Active Labor on 03/26/2020. Patient had an uncomplicated labor course as follows:  Membrane Rupture Time/Date: 8:30 AM ,03/26/2020   Delivery Method:Vaginal, Spontaneous  Episiotomy: None  Lacerations:  2nd degree;Perineal  Patient had an uncomplicated postpartum course.  She is ambulating, tolerating a regular diet, passing flatus, and urinating well. Patient is discharged home in stable condition on 03/27/20.  Newborn Data: Birth date:03/26/2020  Birth time:8:50 AM  Gender:Female  Living status:Living  Apgars:9 ,9  Weight:3226 g   Magnesium Sulfate received: No BMZ received: No Rhophylac:N/A MMR:N/A T-DaP:Given prenatally Flu: No Transfusion:No  Physical exam  Vitals:   03/26/20 1722 03/26/20 2130 03/27/20 0130 03/27/20 0511  BP: 112/79 119/86 122/85 130/87  Pulse: 98 (!) 104  79  Resp: 16 16 16 16   Temp: 98.4 F (36.9 C) 98.2 F (36.8 C) 98.1 F (36.7 C) 97.6 F (36.4 C)  TempSrc: Oral Axillary Tympanic Axillary  SpO2: 100%     Weight:      Height:       General: alert, cooperative and no distress Lochia: appropriate Uterine Fundus:  firm Incision: N/A DVT Evaluation: No evidence of DVT seen on physical exam. No cords or calf tenderness. No significant calf/ankle edema. Labs: Lab Results  Component Value Date   WBC 11.4 (H) 03/27/2020   HGB 11.7 (L) 03/27/2020   HCT 35.8 (L) 03/27/2020   MCV 89.3 03/27/2020   PLT 158 03/27/2020   No flowsheet data found. Edinburgh Score: Edinburgh Postnatal Depression Scale Screening Tool 03/26/2020  I have been able to laugh and see the funny side of things. (No Data)     After visit meds:  Allergies as of 03/27/2020      Reactions   Motrin [ibuprofen] Other (See Comments)   Nose bleeds Nose bleeds      Medication List    TAKE these medications   albuterol 108 (90 Base) MCG/ACT inhaler Commonly known as: VENTOLIN HFA Inhale 2 puffs into the lungs every 6 (six) hours as needed for wheezing or shortness of breath.   levothyroxine 25 MCG tablet Commonly known as: SYNTHROID Take 25 mcg by mouth daily.   multivitamin-prenatal 27-0.8 MG Tabs tablet Take 1 tablet by mouth daily at 12 noon.        Discharge home in stable condition Infant Feeding: Breast Infant Disposition:home with mother Discharge instruction: per After Visit Summary and Postpartum booklet. Activity: Advance as tolerated. Pelvic rest for 6 weeks.  Diet: routine diet Future Appointments:  Future Appointments  Date Time Provider [place]  05/01/2020  2:10 PM Cresenzo-Dishmon, Joaquim Lai, CNM [facility]   Follow up Visit:   Please schedule this patient for a Virtual postpartum visit in 6 weeks with the following provider: Any provider. Additional Postpartum F/U:None  Low risk pregnancy complicated by: No complications Delivery mode:  Vaginal, Spontaneous  Anticipated Birth Control:  None, required IUI for this pregnancy

## 2020-03-26 NOTE — MAU Note (Signed)
Contractions since 1am, water broke around 11pm, positive fm, uncomplicated pregnancy

## 2020-03-26 NOTE — H&P (Signed)
OBSTETRIC ADMISSION HISTORY AND PHYSICAL  Krista Caldwell is a 39 y.o. female G36P1011 with IUP at [redacted]w[redacted]d presenting for suspected labor - leaking fluids and onset of contractions. She reports +FMs. No LOF, VB, blurry vision, headaches, peripheral edema, or RUQ pain. She plans on breastfeeding. She requests nothing for birth control, she needed IUI for this pregnancy.  Dating: By Myrtice Lauth U/S --->  Estimated Date of Delivery: 03/22/20  Sono:    @[redacted]w[redacted]d , normal anatomy, cephalic presentation, 6378H, 52%ile   Prenatal History/Complications: Hypothyroid  Past Medical History: Past Medical History:  Diagnosis Date  . Asthma     Past Surgical History: Past Surgical History:  Procedure Laterality Date  . NO PAST SURGERIES      Obstetrical History: OB History    Gravida  3   Para  1   Term  1   Preterm      AB  1   Living  1     SAB  1   TAB  0   Ectopic      Multiple  0   Live Births  1           Social History: Social History   Socioeconomic History  . Marital status: Married    Spouse name: Not on file  . Number of children: 1  . Years of education: Not on file  . Highest education level: Not on file  Occupational History  . Not on file  Tobacco Use  . Smoking status: Never Smoker  . Smokeless tobacco: Never Used  Vaping Use  . Vaping Use: Never used  Substance and Sexual Activity  . Alcohol use: No  . Drug use: No  . Sexual activity: Yes    Birth control/protection: None  Other Topics Concern  . Not on file  Social History Narrative  . Not on file   Social Determinants of Health   Financial Resource Strain:   . Difficulty of Paying Living Expenses:   Food Insecurity:   . Worried About Charity fundraiser in the Last Year:   . Arboriculturist in the Last Year:   Transportation Needs:   . Film/video editor (Medical):   Marland Kitchen Lack of Transportation (Non-Medical):   Physical Activity:   . Days of Exercise per Week:   . Minutes of  Exercise per Session:   Stress:   . Feeling of Stress :   Social Connections:   . Frequency of Communication with Friends and Family:   . Frequency of Social Gatherings with Friends and Family:   . Attends Religious Services:   . Active Member of Clubs or Organizations:   . Attends Archivist Meetings:   Marland Kitchen Marital Status:     Family History: Family History  Problem Relation Age of Onset  . Stroke Maternal Grandmother   . Heart disease Maternal Grandfather        heart attack  . Asthma Paternal Grandmother   . Dementia Paternal Grandfather   . Cancer Father        testicular  . Hypertension Father   . Hypertension Mother     Allergies: Allergies  Allergen Reactions  . Motrin [Ibuprofen] Other (See Comments)    Nose bleeds Nose bleeds    Medications Prior to Admission  Medication Sig Dispense Refill Last Dose  . albuterol (VENTOLIN HFA) 108 (90 Base) MCG/ACT inhaler Inhale 2 puffs into the lungs every 6 (six) hours as needed for wheezing or shortness  of breath. 6.7 g 3 Past Week at Unknown time  . levothyroxine (SYNTHROID) 25 MCG tablet Take 25 mcg by mouth daily.   03/25/2020 at 0600  . Prenatal Vit-Fe Fumarate-FA (MULTIVITAMIN-PRENATAL) 27-0.8 MG TABS tablet Take 1 tablet by mouth daily at 12 noon.   03/25/2020 at 2100     Review of Systems:  All systems reviewed and negative except as stated in HPI  PE: Blood pressure 134/75, pulse 93, temperature 98.6 F (37 C), temperature source Oral, resp. rate 16, height 5\' 8"  (1.727 m), weight 106.6 kg, SpO2 100 %. General appearance: alert, cooperative, appears stated age, mild distress and in advanced labor with urge to push Lungs: regular rate and effort Heart: regular rate  Abdomen: soft, non-tender Extremities: Homans sign is negative, no sign of DVT Presentation: cephalic EFM: 144 bpm, moderate variability, + accels, no decels Toco: q48min SVE: 10/100/+2  Prenatal labs: ABO, Rh: O/Positive/-- (12/11  1140) Antibody: Negative (03/17 0842) Rubella: 3.30 (12/11 1140) RPR: Non Reactive (03/17 0842)  HBsAg: Negative (12/11 1140)  HIV: Non Reactive (03/17 0842)  GBS: Negative/-- (05/26 1600)  2 hr GTT 77/139/134  Prenatal Transfer Tool  Maternal Diabetes: No Genetic Screening: Declined Maternal Ultrasounds/Referrals: Normal Fetal Ultrasounds or other Referrals:  None Maternal Substance Abuse:  No Significant Maternal Medications:  None Significant Maternal Lab Results: Group B Strep negative  Results for orders placed or performed during the hospital encounter of 03/26/20 (from the past 24 hour(s))  POCT fern test   Collection Time: 03/26/20  7:09 AM  Result Value Ref Range   POCT Fern Test Negative = intact amniotic membranes   Results for orders placed or performed in visit on 03/25/20 (from the past 24 hour(s))  POC Urinalysis Dipstick OB   Collection Time: 03/25/20  9:47 AM  Result Value Ref Range   Color, UA     Clarity, UA     Glucose, UA Negative Negative   Bilirubin, UA     Ketones, UA neg    Spec Grav, UA     Blood, UA neg    pH, UA     POC,PROTEIN,UA Negative Negative, Trace, Small (1+), Moderate (2+), Large (3+), 4+   Urobilinogen, UA     Nitrite, UA neg    Leukocytes, UA Trace (A) Negative   Appearance     Odor      Patient Active Problem List   Diagnosis Date Noted  . Post term pregnancy at [redacted] weeks gestation 03/26/2020  . Pregnancy with history of infertility, antepartum 02/13/2020  . Supervision of normal pregnancy 09/14/2019  . Hypothyroidism 09/14/2019  . History of miscarriage 11/23/2017    Assessment: Krista Caldwell is a 39 y.o. G3P1011 at [redacted]w[redacted]d here for leaking fluids and onset of contractions.  1. Labor: pushing 2. FWB: cat 1 3. Pain: desired epidural but pushing involuntarily, FOB and nurses at bedside for support 4. GBS: negative   Plan: Admit to L&D, anticipate SVD.  [redacted]w[redacted]d, CNM  03/26/2020, 9:23 AM

## 2020-03-26 NOTE — MAU Provider Note (Signed)
Chief Complaint:  Rupture of Membranes and Contractions   Seen by provider at  0700   HPI: Krista Caldwell is a 39 y.o. G3P1011 at 66w4dwho presents to maternity admissions reporting leaking fluids from vagina since 0100 with contractions soon thereafter. . She reports good fetal movement, denies vaginal bleeding, vaginal itching/burning, urinary symptoms, h/a, dizziness, n/v, diarrhea, constipation or fever/chills.  She denies headache, visual changes or RUQ abdominal pain.  Vaginal Discharge The patient's primary symptoms include pelvic pain and vaginal discharge. The patient's pertinent negatives include no genital itching, genital lesions, genital odor or vaginal bleeding. This is a new problem. The current episode started today. The problem occurs intermittently. The problem has been unchanged. The pain is moderate. The problem affects both sides. She is pregnant. Associated symptoms include abdominal pain. Pertinent negatives include no chills, constipation, diarrhea, fever, nausea or vomiting. The vaginal discharge was clear (pink tinged). The vaginal bleeding is spotting. She has not been passing clots. She has not been passing tissue. Nothing aggravates the symptoms. She has tried nothing for the symptoms.    RN Note: Contractions since 1am, water broke around 11pm, positive fm, uncomplicated pregnancy  Past Medical History: Past Medical History:  Diagnosis Date  . Asthma     Past obstetric history: OB History  Gravida Para Term Preterm AB Living  3 1 1   1 1   SAB TAB Ectopic Multiple Live Births  1 0   0 1    # Outcome Date GA Lbr Len/2nd Weight Sex Delivery Anes PTL Lv  3 Current           2 SAB 10/10/17          1 Term 04/08/15 [redacted]w[redacted]d / 00:44 3096 g F Vag-Spont EPI  LIV    Past Surgical History: Past Surgical History:  Procedure Laterality Date  . NO PAST SURGERIES      Family History: Family History  Problem Relation Age of Onset  . Stroke Maternal Grandmother    . Heart disease Maternal Grandfather        heart attack  . Asthma Paternal Grandmother   . Dementia Paternal Grandfather   . Cancer Father        testicular  . Hypertension Father   . Hypertension Mother     Social History: Social History   Tobacco Use  . Smoking status: Never Smoker  . Smokeless tobacco: Never Used  Vaping Use  . Vaping Use: Never used  Substance Use Topics  . Alcohol use: No  . Drug use: No    Allergies:  Allergies  Allergen Reactions  . Motrin [Ibuprofen] Other (See Comments)    Nose bleeds Nose bleeds    Meds:  Medications Prior to Admission  Medication Sig Dispense Refill Last Dose  . albuterol (VENTOLIN HFA) 108 (90 Base) MCG/ACT inhaler Inhale 2 puffs into the lungs every 6 (six) hours as needed for wheezing or shortness of breath. 6.7 g 3 Past Week at Unknown time  . levothyroxine (SYNTHROID) 25 MCG tablet Take 25 mcg by mouth daily.   03/25/2020 at 0600  . Prenatal Vit-Fe Fumarate-FA (MULTIVITAMIN-PRENATAL) 27-0.8 MG TABS tablet Take 1 tablet by mouth daily at 12 noon.   03/25/2020 at 2100    I have reviewed patient's Past Medical Hx, Surgical Hx, Family Hx, Social Hx, medications and allergies.   ROS:  Review of Systems  Constitutional: Negative for chills and fever.  Gastrointestinal: Positive for abdominal pain. Negative for constipation, diarrhea, nausea  and vomiting.  Genitourinary: Positive for pelvic pain and vaginal discharge.   Other systems negative  Physical Exam   Patient Vitals for the past 24 hrs:  BP Temp Temp src Pulse Resp SpO2 Height Weight  03/26/20 0551 128/89 98.3 F (36.8 C) Oral 94 20 100 % 5\' 8"  (1.727 m) 106.6 kg   Constitutional: Well-developed, well-nourished female in no acute distress.  Cardiovascular: normal rate and rhythm Respiratory: normal effort, clear to auscultation bilaterally GI: Abd soft, non-tender, gravid appropriate for gestational age.   No rebound or guarding. MS: Extremities  nontender, no edema, normal ROM Neurologic: Alert and oriented x 4.  GU: Neg CVAT.  PELVIC EXAM: Cervix pink, visually closed, without lesion, scant pink mucous discharge, vaginal walls and external genitalia normal  Dilation: 2 Effacement (%): 80 Station: -2 Presentation: Vertex Exam by:: [email or web address], CNM  FHT:  Baseline 140 , moderate variability, accelerations present, no decelerations Contractions:  Irregular     Labs: Results for orders placed or performed during the hospital encounter of 03/26/20 (from the past 24 hour(s))  POCT fern test     Status: None   Collection Time: 03/26/20  7:09 AM  Result Value Ref Range   POCT Fern Test Negative = intact amniotic membranes    O/Positive/-- (12/11 1140)  Imaging:   MAU Course/MDM: NST reviewed, reactive, category I  Treatments in MAU included EFM, SSE.    Assessment: Single IUP at [redacted]w[redacted]d Vaginal discharge in pregnancy No evidence of ruptured membranes Labor evaluation  Plan: RN will complete labor eval   [redacted]w[redacted]d CNM, MSN Certified Nurse-Midwife 03/26/2020 7:45 AM

## 2020-03-27 LAB — CBC
HCT: 35.8 % — ABNORMAL LOW (ref 36.0–46.0)
Hemoglobin: 11.7 g/dL — ABNORMAL LOW (ref 12.0–15.0)
MCH: 29.2 pg (ref 26.0–34.0)
MCHC: 32.7 g/dL (ref 30.0–36.0)
MCV: 89.3 fL (ref 80.0–100.0)
Platelets: 158 10*3/uL (ref 150–400)
RBC: 4.01 MIL/uL (ref 3.87–5.11)
RDW: 13.1 % (ref 11.5–15.5)
WBC: 11.4 10*3/uL — ABNORMAL HIGH (ref 4.0–10.5)
nRBC: 0 % (ref 0.0–0.2)

## 2020-03-27 NOTE — Discharge Instructions (Signed)

## 2020-03-27 NOTE — Lactation Note (Signed)
This note was copied from a baby's chart. Lactation Consultation Note  Patient Name: Krista Caldwell Date: 03/27/2020 Reason for consult: Initial assessment;Term  P2 mother whose infant is now 34 hours old.  This is a term baby at 40+4 weeks.  Mother breast fed her first child (now 38 years old) for one year.  Mother and baby have been discharged.  Completed the initial visit and the discharge visit with one consult.  Mother feels very comfortable with her breast feeding ability and baby has latched and fed well since birth.  Mother will continue to feed 8-12 times/24 hours or sooner if baby shows feeding cues.  Suggested hand expression before/after feedings to help increase milk supply.  Mother had no questions/concerns.  She did not wish to review engorgement prevention/treatment.  She did not want a manual pump.  Mother does have a DEBP for home use.    Father present and asleep on the couch; family ready to go home and be with their other child.  Mother has our OP phone number for any future questions.  RN updated.   Maternal Data Formula Feeding for Exclusion: No Has patient been taught Hand Expression?: Yes Does the patient have breastfeeding experience prior to this delivery?: Yes  Feeding Feeding Type: Breast Fed  LATCH Score                   Interventions    Lactation Tools Discussed/Used     Consult Status Consult Status: Complete    Krista Caldwell 03/27/2020, 12:03 PM

## 2020-03-29 ENCOUNTER — Inpatient Hospital Stay (HOSPITAL_COMMUNITY)
Admission: RE | Admit: 2020-03-29 | Payer: BC Managed Care – PPO | Source: Home / Self Care | Admitting: Obstetrics & Gynecology

## 2020-03-29 ENCOUNTER — Inpatient Hospital Stay (HOSPITAL_COMMUNITY): Payer: BC Managed Care – PPO

## 2020-04-21 ENCOUNTER — Encounter: Payer: Self-pay | Admitting: Women's Health

## 2020-04-21 ENCOUNTER — Telehealth (INDEPENDENT_AMBULATORY_CARE_PROVIDER_SITE_OTHER): Payer: BC Managed Care – PPO | Admitting: Women's Health

## 2020-04-21 NOTE — Patient Instructions (Signed)
Tips To Increase Milk Supply  Lots of water! Enough so that your urine is clear  Plenty of calories, if you're not getting enough calories, your milk supply can decrease  Breastfeed/pump often, every 2-3 hours x 20-30mins  Fenugreek 3 pills 3 times a day, this may make your urine smell like maple syrup  Mother's Milk Tea  Lactation cookies, google for the recipe  Real oatmeal  Body Armor sports drinks  

## 2020-04-21 NOTE — Progress Notes (Signed)
TELEHEALTH VIRTUAL POSTPARTUM VISIT ENCOUNTER NOTE Patient name: Krista Caldwell MRN [id number]  Date of birth: Sep 22, 1981  I connected with patient on 04/21/20 at  1:50 PM EDT by MyChart video and verified that I am speaking with the correct person using two identifiers. Pt is not currently in our office, she is at home.  The provider is in the office.    I discussed the limitations, risks, security and privacy concerns of performing an evaluation and management service by telephone and the availability of in person appointments. I also discussed with the patient that there may be a patient responsible charge related to this service. The patient expressed understanding and agreed to proceed.  Chief Complaint:   Postpartum Care  History of Present Illness:   Krista Caldwell is a 39 y.o. G56P2012 Caucasian female being evaluated today for a postpartum visit. She is 4 weeks postpartum following a spontaneous vaginal delivery at 40.4 gestational weeks. Anesthesia: local for repair. I have fully reviewed the prenatal and intrapartum course. Pregnancy uncomplicated. Postpartum course has been uncomplicated. Bleeding thin lochia. Bowel function is normal. Bladder function is normal.  Patient is not sexually active. Last sexual activity: prior to birth of baby.   No LMP recorded.  Baby's course has been uncomplicated. Baby is feeding by breast   Edinburgh Postpartum Depression Screening: negative  Edinburgh Postnatal Depression Scale - 04/21/20 1411      Edinburgh Postnatal Depression Scale:  In the Past 7 Days   I have been able to laugh and see the funny side of things. 0    I have looked forward with enjoyment to things. 0    I have blamed myself unnecessarily when things went wrong. 0    I have been anxious or worried for no good reason. 0    I have felt scared or panicky for no good reason. 0    Things have been getting on top of me. 0    I have been so unhappy that I have had  difficulty sleeping. 0    I have felt sad or miserable. 0    I have been so unhappy that I have been crying. 0    The thought of harming myself has occurred to me. 0    Edinburgh Postnatal Depression Scale Total 0          Review of Systems:   Pertinent items are noted in HPI Denies Abnormal vaginal discharge w/ itching/odor/irritation, headaches, visual changes, shortness of breath, chest pain, abdominal pain, severe nausea/vomiting, or problems with urination or bowel movements. Pertinent History Reviewed:  Reviewed past medical,surgical, obstetrical and family history.  Reviewed problem list, medications and allergies. OB History  Gravida Para Term Preterm AB Living  _0 SAB TAB Ectopic Multiple Live Births  1 0   0 2    # Outcome Date GA Lbr Len/2nd Weight Sex Delivery Anes PTL Lv  3 Term 03/26/20 [id number]:40 / 00:10 7 lb 1.8 oz (3.226 kg) F Vag-Spont None  LIV     Birth Comments: WNL  2 SAB 10/10/17          1 Term 04/08/15 [id number] 00:44 6 lb 13.2 oz (3.096 kg) F Vag-Spont EPI  LIV   Physical Assessment:   Vitals:   04/21/20 1410  BP: 117/70  Pulse: 77  There is no height or weight on file to calculate BMI.       Physical Examination:  General:  Alert, oriented and cooperative.   Mental Status: Normal mood and affect perceived. Normal judgment and thought content.  Rest of physical exam deferred due to type of encounter       No results found for this or any previous visit (from the past 24 hour(s)).  Assessment & Plan:  1) Postpartum exam 2) 4 wks s/p SVB 3) Breastfeeding 4) Depression screening 5) Contraception counseling  Essential components of care per ACOG recommendations:  1.  Mood and well being:  . Patient with negative depression screening today. . Pre-existing mental health disorders? No  . Patient does not use tobacco.  . Substance use disorder? No    2. Infant care and feeding:  . Patient currently breastfeeding? Yes  . Infant has a  pediatrician/family doctor? Yes  . Recommended that all caregivers be immunized for flu, pertussis and other preventable communicable diseases . Pt does have material needs met such as stable housing, utilities, food and diapers. If not, referred to local resources for help obtaining these.  3. Sexuality, contraception and birth spacing . Provided guidance regarding sexuality, management of dyspareunia, and resumption of intercourse . Patient is not sure if she  want a pregnancy in the future.   . Discussed avoiding interpregnancy interval <70mhs and recommended birth spacing of 18 months . Reviewed forms of contraception. Patient desires none, had to do IUI for this pregnancy    4. Sleep and fatigue . Discussed coping options for fatigue and sleep disruption . Encouraged family/partner/community support of 4 hrs of uninterrupted sleep to help with mood and fatigue  5. Physical recovery  . Pt did not have a cesarean section. . Patient had a 2nd degree laceration, perineal healing and pain reviewed.  . Patient has urinary incontinence? No, fecal incontinence? No  . Patient is safe to resume physical activity. Discussed attainment of healthy weight.  6.  Chronic disease management . Discussed pregnancy complications if any, and their implications for future childbearing and long-term maternal health. . Review recommendations for prevention of recurrent pregnancy complications, such as 17 hydroxyprogesterone caproate to reduce risk for recurrent PTB not applicable, or aspirin to reduce risk of preeclampsia not applicable. . Pt had GDM: No. If yes, 2hr GTT scheduled: No. . Reviewed medications and non-pregnant dosing including consideration of whether pt is breastfeeding using a reliable resource such as [employer]: not applicable . Referred for f/u w/ PCP or subspecialist providers as indicated: not applicable  7. Health maintenance . Last pap smear 11/23/17 and results were normal . Mammogram  at 38yo or earlier if indicated  Meds: No orders of the defined types were placed in this encounter.   The patient was advised to call back or seek an in-person evaluation/go to the ED for any concerning postpartum symptoms.  I provided 20 minutes of non-face-to-face time during this encounter.  Follow-up: Return for Feb for , Pap & physical.   No orders of the defined types were placed in this encounter.   [facility]7/19/2021 2:42 PM

## 2020-05-01 ENCOUNTER — Telehealth: Payer: BC Managed Care – PPO | Admitting: Advanced Practice Midwife
